# Patient Record
Sex: Female | Born: 1960
Health system: Southern US, Community
[De-identification: ages and names within clinical notes are randomized; demographics above are authoritative.]

## PROBLEM LIST (undated history)

## (undated) DIAGNOSIS — R053 Chronic cough: Secondary | ICD-10-CM

## (undated) DIAGNOSIS — R05 Cough: Secondary | ICD-10-CM

## (undated) DIAGNOSIS — K219 Gastro-esophageal reflux disease without esophagitis: Secondary | ICD-10-CM

## (undated) DIAGNOSIS — Z9889 Other specified postprocedural states: Secondary | ICD-10-CM

## (undated) DIAGNOSIS — J40 Bronchitis, not specified as acute or chronic: Secondary | ICD-10-CM

## (undated) DIAGNOSIS — R112 Nausea with vomiting, unspecified: Secondary | ICD-10-CM

## (undated) DIAGNOSIS — J45909 Unspecified asthma, uncomplicated: Secondary | ICD-10-CM

## (undated) HISTORY — PX: TONSILLECTOMY: SUR1361

## (undated) HISTORY — DX: Chronic cough: R05.3

## (undated) HISTORY — DX: Cough: R05

## (undated) HISTORY — PX: OOPHORECTOMY: SHX86

---

## 2001-12-31 ENCOUNTER — Other Ambulatory Visit: Admission: RE | Admit: 2001-12-31 | Discharge: 2001-12-31 | Payer: Self-pay | Admitting: Obstetrics and Gynecology

## 2003-02-03 ENCOUNTER — Other Ambulatory Visit: Admission: RE | Admit: 2003-02-03 | Discharge: 2003-02-03 | Payer: Self-pay | Admitting: Obstetrics and Gynecology

## 2004-04-20 ENCOUNTER — Other Ambulatory Visit: Admission: RE | Admit: 2004-04-20 | Discharge: 2004-04-20 | Payer: Self-pay | Admitting: Obstetrics and Gynecology

## 2004-04-30 ENCOUNTER — Encounter: Admission: RE | Admit: 2004-04-30 | Discharge: 2004-04-30 | Payer: Self-pay | Admitting: Obstetrics and Gynecology

## 2004-10-21 ENCOUNTER — Encounter: Admission: RE | Admit: 2004-10-21 | Discharge: 2004-10-21 | Payer: Self-pay | Admitting: Obstetrics and Gynecology

## 2005-08-03 ENCOUNTER — Other Ambulatory Visit: Admission: RE | Admit: 2005-08-03 | Discharge: 2005-08-03 | Payer: Self-pay | Admitting: Obstetrics and Gynecology

## 2005-08-03 ENCOUNTER — Encounter: Admission: RE | Admit: 2005-08-03 | Discharge: 2005-08-03 | Payer: Self-pay | Admitting: Obstetrics and Gynecology

## 2006-10-05 ENCOUNTER — Encounter: Admission: RE | Admit: 2006-10-05 | Discharge: 2006-10-05 | Payer: Self-pay | Admitting: Obstetrics and Gynecology

## 2007-10-08 ENCOUNTER — Encounter: Admission: RE | Admit: 2007-10-08 | Discharge: 2007-10-08 | Payer: Self-pay | Admitting: Obstetrics and Gynecology

## 2010-12-26 ENCOUNTER — Encounter: Payer: Self-pay | Admitting: Obstetrics and Gynecology

## 2011-05-10 ENCOUNTER — Encounter: Payer: Self-pay | Admitting: Internal Medicine

## 2011-05-12 ENCOUNTER — Encounter: Payer: Self-pay | Admitting: Internal Medicine

## 2011-05-12 ENCOUNTER — Ambulatory Visit (INDEPENDENT_AMBULATORY_CARE_PROVIDER_SITE_OTHER): Payer: BC Managed Care – PPO | Admitting: Internal Medicine

## 2011-05-12 VITALS — BP 126/82 | HR 87 | Temp 98.5°F | Ht 63.0 in | Wt 206.4 lb

## 2011-05-12 DIAGNOSIS — R053 Chronic cough: Secondary | ICD-10-CM | POA: Insufficient documentation

## 2011-05-12 DIAGNOSIS — R059 Cough, unspecified: Secondary | ICD-10-CM

## 2011-05-12 DIAGNOSIS — R05 Cough: Secondary | ICD-10-CM | POA: Insufficient documentation

## 2011-05-12 NOTE — Assessment & Plan Note (Addendum)
Your chronic cough is probably combination of sinus drainage, possible  Acid reflux, possibe asthma, possible cyclical cough # for sinus  - stop cetrizine and other anti histamines  - take chlorpheniramine 4mg  x 2 tablets each night - if this makes you too sleepy or dry back off to 1 tablet daily  - take sudafed 12h XR tablet x 1 daily  - try QNASL 2 squirts each nostril daily - take 2 samples # for acid reflux  - take diet sheet  - take over the counter zegerid 20mg  capsule once each morning on empty stomach   - stop fish oil # for possible cyclical cough  - suck on lozenges or sugarless mint or drink water each time you have an urge to cough #for possible asthma  - do methacholine challenge test  #FOLLOWUP  - important you follow each advice 100%  - return in 4-6 weeks - depending on course will get CT scan\ - repeat exertional pulse ox at followup (CMA noted she desaturated but did not inform me of that when patient left office)

## 2011-05-12 NOTE — Patient Instructions (Addendum)
Your chronic cough is probably combination of sinus drainage, possible  Acid reflux, possibe asthma, possible cyclical cough # for sinus  - stop cetrizine and other anti histamines  - take chlorpheniramine 4mg  x 2 tablets each night - if this makes you too sleepy or dry back off to 1 tablet daily  - take sudafed 12h XR tablet x 1 daily  - try QNASL 2 squirts each nostril daily - take 2 samples # for acid reflux  - take diet sheet  - take over the counter zegerid 20mg  capsule once each morning on empty stomach   - stop fish oil # for possible cyclical cough  - suck on lozenges or sugarless mint or drink water each time you have an urge to cough #for possible asthma  - do methacholine challenge test  #FOLLOWUP  - important you follow each advice 100%  - return in 4-6 weeks - depending on course CT scan - repeat exertional pulse ox at followup (CMA notes she desaturated but I was not informed)

## 2011-05-12 NOTE — Progress Notes (Signed)
Subjective:    Patient ID: Charlotte Roth, female    DOB: 12/29/1960, 50 y.o.   MRN: [id number]  Cough This is a new (50 year old overweight/obese female. CHronic Cough x all life. Dad died from pulmonary fibrosis 04-Sep-2010(Dr Danise Mina), Mom with asthma. So she is concerned about cough) problem. The current episode started more than 1 year ago (Present "all life" as long as a teenager and she could remember. ). The problem has been waxing and waning (winter time and cold air make it worse). The problem occurs constantly. The cough is productive of sputum (clear white sputum but rarely has color when has infection; took antibiotics one month ago ). Associated symptoms include postnasal drip. Pertinent negatives include no chest pain, chills, ear congestion, ear pain, eye redness, fever, headaches, heartburn, hemoptysis, myalgias, nasal congestion, rash, rhinorrhea, sore throat, shortness of breath, sweats, weight loss or wheezing. Associated symptoms comments: Socially embarrassing cough. People at work and husband upset about cough. Reports long hx of allergies - food allergies years ago, seasonal like pollen, dust, cats, dogs. Positive skin test by Dr. Madison Hickman in 1990s in [place] and was on allergy shots for few years in the 1990s that she did not think helped. Has strong GAG + and THROWS UP OFTEN ESP at NIGHT  - atleast 1-2 times per week. Denies diagnosis of GERD or heartburn but admits to STRONG POSNASAL DRIP. GAG - brought on by cougghing and sinus drainage and netti pot. The symptoms are aggravated by cold air, dust, exercise, fumes and pollens (Has 2 cats but she is unsure if it makes it worse though in 1990s was skin test positive for cats. Talking a lot during [employer] job makes it worse). 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 She has tried OTC cough suppressant (remote past has had allergy  shots. Tessalon perles do not help. Past year tried zyrtec, allergra - no help. Netti pot  helps somewhat but makes her GAG +) for the symptoms. The treatment provided no relief. Her past medical history is significant for environmental allergies. There is no history of asthma, bronchiectasis, bronchitis, COPD, emphysema or pneumonia.      Review of Systems  Constitutional: Positive for unexpected weight change. Negative for fever, chills and weight loss.  HENT: Positive for congestion and postnasal drip. Negative for ear pain, nosebleeds, sore throat, rhinorrhea, sneezing, trouble swallowing, dental problem and sinus pressure.   Eyes: Negative for redness and itching.  Respiratory: Positive for cough. Negative for hemoptysis, chest tightness, shortness of breath and wheezing.   Cardiovascular: Negative for chest pain, palpitations and leg swelling.  Gastrointestinal: Negative for heartburn, nausea and vomiting.  Genitourinary: Negative for dysuria.  Musculoskeletal: Negative for myalgias and joint swelling.  Skin: Negative for rash.  Neurological: Negative for headaches.  Hematological: Positive for environmental allergies. Does not bruise/bleed easily.  Psychiatric/Behavioral: Negative for dysphoric mood. The patient is not nervous/anxious.        Objective:   Physical Exam  Vitals reviewed. Constitutional: She is oriented to person, place, and time. She appears well-developed and well-nourished. No distress.       obese  HENT:  Head: Normocephalic and atraumatic.  Right Ear: External ear normal.  Left Ear: External ear normal.  Mouth/Throat: Oropharynx is clear and moist. No oropharyngeal exudate.  Eyes: Conjunctivae and EOM are normal. Pupils are equal, round, and reactive to light. Right  eye exhibits no discharge. Left eye exhibits no discharge. No scleral icterus.  Neck: Normal range of motion. Neck supple. No JVD present. No tracheal deviation present. No thyromegaly present.         mallampatti class 2  Cardiovascular: Normal rate, regular rhythm, normal heart sounds and intact distal pulses.  Exam reveals no gallop and no friction rub.   No murmur heard. Pulmonary/Chest: Effort normal and breath sounds normal. No respiratory distress. She has no wheezes. She has no rales. She exhibits no tenderness.  Abdominal: Soft. Bowel sounds are normal. She exhibits no distension and no mass. There is no tenderness. There is no rebound and no guarding.  Musculoskeletal: Normal range of motion. She exhibits no edema and no tenderness.  Lymphadenopathy:    She has no cervical adenopathy.  Neurological: She is alert and oriented to person, place, and time. She has normal reflexes. No cranial nerve deficit. She exhibits normal muscle tone. Coordination normal.  Skin: Skin is warm and dry. No rash noted. She is not diaphoretic. No erythema. No pallor.  Psychiatric: She has a normal mood and affect. Her behavior is normal. Judgment and thought content normal.          Assessment & Plan:

## 2011-05-12 NOTE — Progress Notes (Addendum)
SATURATION QUALIFICATIONS:  Patient Saturations on Room Air at Rest = 93%  Patient Saturations on Room Air while Ambulating = 83%  Patient Saturations on 2 Liters of oxygen while Ambulating = 93%  Walk entered into wrong chart. Pt did not do a walking saturation test. Carron Curie, CMA

## 2011-05-13 ENCOUNTER — Encounter: Payer: Self-pay | Admitting: Internal Medicine

## 2011-05-18 ENCOUNTER — Ambulatory Visit (HOSPITAL_COMMUNITY)
Admission: RE | Admit: 2011-05-18 | Discharge: 2011-05-18 | Disposition: A | Discharge status: Home / Self Care | Payer: BC Managed Care – PPO | Source: Ambulatory Visit | Attending: Internal Medicine | Admitting: Internal Medicine

## 2011-05-18 DIAGNOSIS — R059 Cough, unspecified: Secondary | ICD-10-CM | POA: Insufficient documentation

## 2011-05-18 DIAGNOSIS — R05 Cough: Secondary | ICD-10-CM

## 2011-05-18 DIAGNOSIS — R053 Chronic cough: Secondary | ICD-10-CM

## 2011-06-06 ENCOUNTER — Telehealth: Payer: Self-pay | Admitting: Internal Medicine

## 2011-06-06 NOTE — Telephone Encounter (Signed)
leth her know methacholine challenge test 05/18/2011 is normal and no evidence of asthma to explain cough

## 2011-06-09 NOTE — Telephone Encounter (Signed)
Pt aware. Jennifer Castillo, CMA  

## 2011-06-13 ENCOUNTER — Encounter: Payer: Self-pay | Admitting: Internal Medicine

## 2011-06-28 ENCOUNTER — Encounter: Payer: Self-pay | Admitting: Internal Medicine

## 2011-06-28 ENCOUNTER — Ambulatory Visit (INDEPENDENT_AMBULATORY_CARE_PROVIDER_SITE_OTHER): Payer: BC Managed Care – PPO | Admitting: Internal Medicine

## 2011-06-28 VITALS — BP 120/70 | HR 94 | Temp 98.5°F | Ht 63.5 in | Wt 201.8 lb

## 2011-06-28 DIAGNOSIS — R059 Cough, unspecified: Secondary | ICD-10-CM

## 2011-06-28 DIAGNOSIS — R053 Chronic cough: Secondary | ICD-10-CM

## 2011-06-28 DIAGNOSIS — R05 Cough: Secondary | ICD-10-CM

## 2011-06-28 NOTE — Patient Instructions (Addendum)
Your chronic cough is probably combination of sinus drainage, possible  Acid reflux, possible cyclical cough # for sinus  - continue chlorpheniramine 4mg  x 2 tablets each night - if this makes you too sleepy or dry back off to 1 tablet daily  - take sudafed 12h XR tablet x 1 daily  - continue QNASL 2 squirts each nostril daily - take 2 samples and prescription - iff too expensive call us # for acid reflux  - take diet sheet again and follow all we discussed  - take over the counter zegerid 20mg  capsule once each morning on empty stomach   - do not go back to  fish oil # for possible cyclical cough  - suck on lozenges or sugarless mint or drink water each time you have an urge to cough  - talke 3 days off for complete voice rest - no whispering or talking - in next 2-3 weeks #FOLLOWUP  - important you follow each advice 100%  - return in 6 weeks - depending on course CT scan

## 2011-06-28 NOTE — Progress Notes (Signed)
  Subjective:    Patient ID: Charlotte Roth, female    DOB: 14-Sep-1961, 50 y.o.   MRN: [id number]  HPI Followup Multifactorial cough  OV 06/28/11: Last visit 05/12/11. Multifactorial interventions done - LPR cough with sinus, gerd issues. Following interventions, cough 60% better in terms of severity and frequency. Cough still dry. GAggin and throwing up at night present but reduced. Postnasal drip reduced but still present. Still socially embarrassing. 2 tabs chlorphen making her dry; > 90% compliant. Takes sudafed. Finds QNASL wonderful and best help. However, having difficulty with GERD precatuions especially the diet sheet and zegerid compliance. Notes that coming off zegerid x 3 days, made cough worse. Did have methacholine challenget test - negative on 05/18/11 for asthma.   No new issues  Past medical, Family hx and Social hx: reviewd. No changes noted since last vist   Review of Systems  Constitutional: Negative for fever and unexpected weight change.  HENT: Negative for ear pain, nosebleeds, congestion, sore throat, rhinorrhea, sneezing, trouble swallowing, dental problem, postnasal drip and sinus pressure.   Eyes: Negative for redness and itching.  Respiratory: Positive for cough. Negative for chest tightness, shortness of breath and wheezing.   Cardiovascular: Negative for palpitations and leg swelling.  Gastrointestinal: Negative for nausea and vomiting.  Genitourinary: Negative for dysuria.  Musculoskeletal: Negative for joint swelling.  Skin: Negative for rash.  Neurological: Negative for headaches.  Hematological: Does not bruise/bleed easily.  Psychiatric/Behavioral: Negative for dysphoric mood. The patient is not nervous/anxious.        Objective:   Physical Exam     Vitals reviewed. Constitutional: She is oriented to person, place, and time. She appears well-developed and well-nourished. No distress.       obese  HENT:  Head: Normocephalic and atraumatic.  Right Ear:  External ear normal.  Left Ear: External ear normal.  Mouth/Throat: Oropharynx is clear and moist. No oropharyngeal exudate.  Eyes: Conjunctivae and EOM are normal. Pupils are equal, round, and reactive to light. Right eye exhibits no discharge. Left eye exhibits no discharge. No scleral icterus.  Neck: Normal range of motion. Neck supple. No JVD present. No tracheal deviation present. No thyromegaly present.       mallampatti class 2  Cardiovascular: Normal rate, regular rhythm, normal heart sounds and intact distal pulses.  Exam reveals no gallop and no friction rub.   No murmur heard. Pulmonary/Chest: Effort normal and breath sounds normal. No respiratory distress. She has no wheezes. She has no rales. She exhibits no tenderness.  Abdominal: Soft. Bowel sounds are normal. She exhibits no distension and no mass. There is no tenderness. There is no rebound and no guarding.  Musculoskeletal: Normal range of motion. She exhibits no edema and no tenderness.  Lymphadenopathy:    She has no cervical adenopathy.  Neurological: She is alert and oriented to person, place, and time. She has normal reflexes. No cranial nerve deficit. She exhibits normal muscle tone. Coordination normal.  Skin: Skin is warm and dry. No rash noted. She is not diaphoretic. No erythema. No pallor.  Psychiatric: She has a normal mood and affect. Her behavior is normal. Judgment and thought content normal.      Assessment & Plan:

## 2011-07-22 ENCOUNTER — Encounter: Payer: Self-pay | Admitting: Internal Medicine

## 2011-07-22 NOTE — Assessment & Plan Note (Addendum)
Improved 60% but still with residual cough. Asthma ruled out  PLAN Your chronic cough is probably combination of sinus drainage, possible  Acid reflux,  possible cyclical cough # for sinus  - continue chlorpheniramine 4mg  x 2 tablets each night - if this makes you too sleepy or dry back off to 1 tablet daily  - take sudafed 12h XR tablet x 1 daily  - continue QNASL 2 squirts each nostril daily - take 2 samples and prescription - iff too expensive call us # for acid reflux  - take diet sheet again and follow all we discussed  - take over the counter zegerid 20mg  capsule once each morning on empty stomach   - do not go back to  fish oil # for possible cyclical cough  - suck on lozenges or sugarless mint or drink water each time you have an urge to cough  - talke 3 days off for complete voice rest - no whispering or talking - in next 2-3 weeks #FOLLOWUP  - important you follow each advice 100%  - return in 6 weeks - depending on course CT scan

## 2011-08-17 ENCOUNTER — Encounter: Payer: Self-pay | Admitting: Internal Medicine

## 2011-08-17 ENCOUNTER — Ambulatory Visit (INDEPENDENT_AMBULATORY_CARE_PROVIDER_SITE_OTHER): Payer: BC Managed Care – PPO | Admitting: Internal Medicine

## 2011-08-17 VITALS — BP 126/82 | HR 88 | Temp 98.0°F | Ht 63.0 in | Wt 198.6 lb

## 2011-08-17 DIAGNOSIS — R059 Cough, unspecified: Secondary | ICD-10-CM

## 2011-08-17 DIAGNOSIS — R05 Cough: Secondary | ICD-10-CM

## 2011-08-17 DIAGNOSIS — R053 Chronic cough: Secondary | ICD-10-CM

## 2011-08-17 MED ORDER — GABAPENTIN 300 MG PO CAPS: ORAL_CAPSULE | ORAL | Status: DC

## 2011-08-17 NOTE — Patient Instructions (Signed)
Your chronic cough is probably combination of sinus drainage, possible  Acid reflux, possible cyclical cough # for sinus  - continue chlorpheniramine 4mg  x 2 tablets each night - if this makes you too sleepy or dry back off to 1 tablet daily  - continue sudafed 12h XR tablet x 1 daily  - continue QNASL 2 squirts each nostril daily - take 2 samples   - due to lack of control with above please see ENT Dr Annalee Genta or DR Jenne Pane # for acid reflux  - take diet sheet again and follow all we discussed with 100% compliance atleats for 1-2 months  - take over the counter zegerid 20mg  capsule once each morning on empty stomach   - continue to avoid back to  fish oil # for possible cyclical cough  - suck on lozenges or sugarless mint or drink water each time you have an urge to cough  - talke 3 days off for complete voice rest - no whispering or talking - in next 2-3 weeks  - start generic gabapentin 300mg  daily x 5 days, then 300mg  twice daily x 5 days, then 300mg  three times daily to continue; if making you too sleepy or dry call us #FOLLOWUP  - important you follow each advice 100%  - return in 5-6 weeks - depending on course CT scan of chest

## 2011-08-17 NOTE — Assessment & Plan Note (Addendum)
Cough not being controlled.  Sinus - no control despite 3 drugs - will get her to ENT.  GERD - not compliant with diet. I stressed the need for100% compliance with diet atleast for 6-8 weeks; she agrees. Continue zegerid.  LPR - she agrees to voice rest and trial neurontin after counseling on side effects and recent lancet study.  She is not interested in pursuing CT chest at this point; wants to address above first (her dad had pulmonary fibrosis)  25 min visit. > 50% of time in face to face counseling

## 2011-08-17 NOTE — Progress Notes (Signed)
Subjective:    Patient ID: Charlotte Roth, female    DOB: 04-03-1961, 50 y.o.   MRN: [id number]  HPI Followup Multifactorial cough  OV 06/28/11: Last visit 05/12/11. Multifactorial interventions done - LPR cough with sinus, gerd issues. Following interventions, cough 60% better in terms of severity and frequency. Cough still dry. GAggin and throwing up at night present but reduced. Postnasal drip reduced but still present. Still socially embarrassing. 2 tabs chlorphen making her dry; > 90% compliant. Takes sudafed. Finds QNASL wonderful and best help. However, having difficulty with GERD precatuions especially the diet sheet and zegerid compliance. Notes that coming off zegerid x 3 days, made cough worse. Did have methacholine challenget test - negative on 05/18/11 for asthma.   No new issues  Your chronic cough is probably combination of sinus drainage, possible Acid reflux, possible cyclical cough  # for sinus  - continue chlorpheniramine 4mg  x 2 tablets each night - if this makes you too sleepy or dry back off to 1 tablet daily  - take sudafed 12h XR tablet x 1 daily  - continue QNASL 2 squirts each nostril daily - take 2 samples and prescription - iff too expensive call us  # for acid reflux  - take diet sheet again and follow all we discussed  - take over the counter zegerid 20mg  capsule once each morning on empty stomach  - do not go back to fish oil  # for possible cyclical cough  - suck on lozenges or sugarless mint or drink water each time you have an urge to cough  - talke 3 days off for complete voice rest - no whispering or talking - in next 2-3 weeks  #FOLLOWUP  - important you follow each advice 100%  - return in 6 weeks  - depending on course CT scan  OV 08/17/11: Followup for above. States that cough imprved till week ago but past week cough is worse. States significantly worse - worse than prior ov in July but not as bad as first time she saw me (2 visits ago). Currently rates  cough as moderate. Cough currently productive - whitish and orange (before it was clear). No associated wheezing but gags + when cough present. RSI cough score is 12 of 35 which is below the cut off for LPR cough but then she is on partial Rx. Significant associated post nasal drainage persists; this is worse. Post nsal drip worse despite taking 2 tabs chlorphen at night, morning sudafed and daily QNASL for which she is compliant. In terms of GERD control: compliant with zegerid which she is compliant. But is struggling with diet control; made some improvements like changing to decaf coffee but feels she is not strict about diet at all. Of note, did not do 3 days of voice rest (she says she talks a lot and cannot get 3 days to get voice rest though she joked that her husband would love it. 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     Review of Systems  Constitutional: Negative for fever and unexpected weight change.  HENT: Positive for congestion and postnasal drip. Negative for ear pain, nosebleeds, sore throat, rhinorrhea, sneezing, trouble swallowing, dental problem and sinus pressure.   Eyes: Negative for redness and itching.  Respiratory: Positive for cough. Negative for chest tightness, shortness of breath and wheezing.   Cardiovascular: Negative for palpitations and leg swelling.  Gastrointestinal: Negative for nausea and vomiting.  Genitourinary: Negative for dysuria.  Musculoskeletal:  Negative for joint swelling.  Skin: Negative for rash.  Neurological: Positive for dizziness. Negative for headaches.  Hematological: Does not bruise/bleed easily.  Psychiatric/Behavioral: Negative for dysphoric mood. The patient is not nervous/anxious.        Objective:   Physical Exam  Vitals reviewed. Constitutional: She is oriented to person, place, and time. She appears well-developed and well-nourished. No distress.       obese  HENT:  Head: Normocephalic and atraumatic.  Right Ear:  External ear normal.  Left Ear: External ear normal.  Mouth/Throat: Oropharynx is clear and moist. No oropharyngeal exudate.  Eyes: Conjunctivae and EOM are normal. Pupils are equal, round, and reactive to light. Right eye exhibits no discharge. Left eye exhibits no discharge. No scleral icterus.  Neck: Normal range of motion. Neck supple. No JVD present. No tracheal deviation present. No thyromegaly present.       mallampatti class 2  Cardiovascular: Normal rate, regular rhythm, normal heart sounds and intact distal pulses.  Exam reveals no gallop and no friction rub.   No murmur heard. Pulmonary/Chest: Effort normal and breath sounds normal. No respiratory distress. She has no wheezes. She has no rales. She exhibits no tenderness.  Abdominal: Soft. Bowel sounds are normal. She exhibits no distension and no mass. There is no tenderness. There is no rebound and no guarding.  Musculoskeletal: Normal range of motion. She exhibits no edema and no tenderness.  Lymphadenopathy:    She has no cervical adenopathy.  Neurological: She is alert and oriented to person, place, and time. She has normal reflexes. No cranial nerve deficit. She exhibits normal muscle tone. Coordination normal.  Skin: Skin is warm and dry. No rash noted. She is not diaphoretic. No erythema. No pallor.  Psychiatric: She has a normal mood and affect. Her behavior is normal. Judgment and thought content normal.        Assessment & Plan:

## 2011-09-26 ENCOUNTER — Ambulatory Visit (INDEPENDENT_AMBULATORY_CARE_PROVIDER_SITE_OTHER): Payer: BC Managed Care – PPO | Admitting: Internal Medicine

## 2011-09-26 ENCOUNTER — Encounter: Payer: Self-pay | Admitting: Internal Medicine

## 2011-09-26 VITALS — BP 130/92 | HR 98 | Temp 98.8°F | Ht 63.0 in | Wt 198.6 lb

## 2011-09-26 DIAGNOSIS — R059 Cough, unspecified: Secondary | ICD-10-CM

## 2011-09-26 DIAGNOSIS — R053 Chronic cough: Secondary | ICD-10-CM

## 2011-09-26 DIAGNOSIS — R05 Cough: Secondary | ICD-10-CM

## 2011-09-26 DIAGNOSIS — Z23 Encounter for immunization: Secondary | ICD-10-CM

## 2011-09-26 NOTE — Patient Instructions (Signed)
I think the bronchitis after the [place] trip has made things worse Glad you are beginning to feel better and you have restarted meds for chronic cough control For sinus, you can stop sudafed due to concerns of bp. But continue QNASL spray (take samples) and night time chlor tab For acid reflux, ok to change from zegerid to omeprazole. But make sure you follow diet sheet strictly (the ent eval showed severe reflux) For cyclical cough element, call me in several weeks to tell me how you feel so we can decide on the neurontin I respect your desire to be minimalist with meds: you will find diet control helps immensely for reflux Return in 3 months or sooner if needed HAve flu shot today

## 2011-09-26 NOTE — Assessment & Plan Note (Signed)
I think the bronchitis after the [place] trip has made things worse Glad you are beginning to feel better and you have restarted meds for chronic cough control For sinus, you can stop sudafed due to your  concerns of possible high bp. But continue QNASL spray (take samples) and night time chlor tab For acid reflux, ok to change from zegerid to omeprazole. But make sure you follow diet sheet strictly (the ent eval showed severe reflux) For cyclical cough element, call me in several weeks to tell me how you feel so we can decide on the neurontin I respect your desire to be minimalist with meds: you will find diet control helps immensely for reflux Return in 3 months or sooner if needed HAve flu shot today

## 2011-09-26 NOTE — Progress Notes (Signed)
Subjective:    Patient ID: Charlotte Roth, female    DOB: 1961-08-26, 50 y.o.   MRN: [id number]  HPI OV 06/28/11: Last visit 05/12/11. Multifactorial interventions done - LPR cough with sinus, gerd issues. Following interventions, cough 60% better in terms of severity and frequency. Cough still dry. GAggin and throwing up at night present but reduced. Postnasal drip reduced but still present. Still socially embarrassing. 2 tabs chlorphen making her dry; > 90% compliant. Takes sudafed. Finds QNASL wonderful and best help. However, having difficulty with GERD precatuions especially the diet sheet and zegerid compliance. Notes that coming off zegerid x 3 days, made cough worse. Did have methacholine challenget test - negative on 05/18/11 for asthma.   No new issues   Your chronic cough is probably combination of sinus drainage, possible Acid reflux, possible cyclical cough  # for sinus  - continue chlorpheniramine 4mg  x 2 tablets each night - if this makes you too sleepy or dry back off to 1 tablet daily  - continue sudafed 12h XR tablet x 1 daily  - continue QNASL 2 squirts each nostril daily - take 2 samples  - due to lack of control with above please see ENT Dr Annalee Genta or DR Jenne Pane  # for acid reflux  - take diet sheet again and follow all we discussed with 100% compliance atleats for 1-2 months  - take over the counter zegerid 20mg  capsule once each morning on empty stomach  - continue to avoid back to fish oil  # for possible cyclical cough  - suck on lozenges or sugarless mint or drink water each time you have an urge to cough  - talke 3 days off for complete voice rest - no whispering or talking - in next 2-3 weeks  - start generic gabapentin 300mg  daily x 5 days, then 300mg  twice daily x 5 days, then 300mg  three times daily to continue; if making you too sleepy or dry call us  #FOLLOWUP  - important you follow each advice 100%  - return in 5-6 weeks  - depending on course CT scan of  chest  OV 09/26/11: Saw ENT 09/01/11: severe GE refulx on their eval per their notes. Per her there was also deviated nasal septum. STates a week after ENT visit cough was improving and then went to [place]. At this point cough had significantly improved. She did not start neurontin due to impending trip. Return a week later (around 2 weeks ago). Developed URI/Bronchitis on return. Has had 2 courses of antbiotics. Cough got much worse but now Cough now better. During this trip and since return has stopped all cough control medications. But starting yesterday has restarted them. She is concerned about impact of polypharmacy. She is currently not very keen to restart neurontin. Not following GE Reflux diet strictly either  Past medical: ENT consult end sept 2012: severe GE reflux. Bronchitis  2 weeks ago Social: recent trip to [place] Family hx: no change  Review of Systems  Constitutional: Negative for fever and unexpected weight change.  HENT: Negative for ear pain, nosebleeds, congestion, sore throat, rhinorrhea, sneezing, trouble swallowing, dental problem, postnasal drip and sinus pressure.   Eyes: Negative for redness and itching.  Respiratory: Positive for cough. Negative for chest tightness, shortness of breath and wheezing.   Cardiovascular: Negative for palpitations and leg swelling.  Gastrointestinal: Negative for nausea and vomiting.  Genitourinary: Negative for dysuria.  Musculoskeletal: Negative for joint swelling.  Skin: Negative for rash.  Neurological:  Negative for headaches.  Hematological: Does not bruise/bleed easily.  Psychiatric/Behavioral: Negative for dysphoric mood. The patient is not nervous/anxious.        Objective:   Physical Exam Constitutional: She is oriented to person, place, and time. She appears well-developed and well-nourished. No distress.       obese  HENT:  Head: Normocephalic and atraumatic.  Right Ear: External ear normal.  Left Ear: External ear  normal.  Mouth/Throat: Oropharynx is clear and moist. No oropharyngeal exudate.  Eyes: Conjunctivae and EOM are normal. Pupils are equal, round, and reactive to light. Right eye exhibits no discharge. Left eye exhibits no discharge. No scleral icterus.  Neck: Normal range of motion. Neck supple. No JVD present. No tracheal deviation present. No thyromegaly present.       mallampatti class 2  Cardiovascular: Normal rate, regular rhythm, normal heart sounds and intact distal pulses.  Exam reveals no gallop and no friction rub.   No murmur heard. Pulmonary/Chest: Effort normal and breath sounds normal. No respiratory distress. She has no wheezes. She has no rales. She exhibits no tenderness.  Abdominal: Soft. Bowel sounds are normal. She exhibits no distension and no mass. There is no tenderness. There is no rebound and no guarding.  Musculoskeletal: Normal range of motion. She exhibits no edema and no tenderness.  Lymphadenopathy:    She has no cervical adenopathy.  Neurological: She is alert and oriented to person, place, and time. She has normal reflexes. No cranial nerve deficit. She exhibits normal muscle tone. Coordination normal.  Skin: Skin is warm and dry. No rash noted. She is not diaphoretic. No erythema. No pallor.  Psychiatric: She has a normal mood and affect. Her behavior is normal. Judgment and thought content normal.         Assessment & Plan:

## 2011-11-01 ENCOUNTER — Other Ambulatory Visit: Payer: Self-pay | Admitting: Allergy and Immunology

## 2011-11-01 ENCOUNTER — Ambulatory Visit
Admission: RE | Admit: 2011-11-01 | Discharge: 2011-11-01 | Disposition: A | Discharge status: Home / Self Care | Payer: BC Managed Care – PPO | Source: Ambulatory Visit | Attending: Allergy and Immunology | Admitting: Allergy and Immunology

## 2011-11-01 DIAGNOSIS — R059 Cough, unspecified: Secondary | ICD-10-CM

## 2011-11-01 DIAGNOSIS — R05 Cough: Secondary | ICD-10-CM

## 2011-11-01 DIAGNOSIS — J329 Chronic sinusitis, unspecified: Secondary | ICD-10-CM

## 2013-05-09 ENCOUNTER — Encounter: Payer: Self-pay | Admitting: Gastroenterology

## 2013-05-13 ENCOUNTER — Ambulatory Visit
Admission: RE | Admit: 2013-05-13 | Discharge: 2013-05-13 | Disposition: A | Discharge status: Home / Self Care | Payer: BC Managed Care – PPO | Source: Ambulatory Visit | Attending: Allergy and Immunology | Admitting: Allergy and Immunology

## 2013-05-13 ENCOUNTER — Other Ambulatory Visit: Payer: Self-pay | Admitting: Allergy and Immunology

## 2013-05-13 DIAGNOSIS — R059 Cough, unspecified: Secondary | ICD-10-CM

## 2013-05-13 DIAGNOSIS — R05 Cough: Secondary | ICD-10-CM

## 2014-04-15 ENCOUNTER — Other Ambulatory Visit: Payer: Self-pay | Admitting: Allergy and Immunology

## 2014-04-15 ENCOUNTER — Ambulatory Visit
Admission: RE | Admit: 2014-04-15 | Discharge: 2014-04-15 | Disposition: A | Discharge status: Home / Self Care | Payer: BC Managed Care – PPO | Source: Ambulatory Visit | Attending: Allergy and Immunology | Admitting: Allergy and Immunology

## 2014-04-15 DIAGNOSIS — R059 Cough, unspecified: Secondary | ICD-10-CM

## 2014-04-15 DIAGNOSIS — R05 Cough: Secondary | ICD-10-CM

## 2014-12-04 ENCOUNTER — Other Ambulatory Visit: Payer: Self-pay | Admitting: Obstetrics and Gynecology

## 2014-12-04 DIAGNOSIS — R928 Other abnormal and inconclusive findings on diagnostic imaging of breast: Secondary | ICD-10-CM

## 2014-12-17 ENCOUNTER — Ambulatory Visit
Admission: RE | Admit: 2014-12-17 | Discharge: 2014-12-17 | Disposition: A | Discharge status: Home / Self Care | Payer: BLUE CROSS/BLUE SHIELD | Source: Ambulatory Visit | Attending: Obstetrics and Gynecology | Admitting: Obstetrics and Gynecology

## 2014-12-17 DIAGNOSIS — R928 Other abnormal and inconclusive findings on diagnostic imaging of breast: Secondary | ICD-10-CM

## 2016-02-16 ENCOUNTER — Institutional Professional Consult (permissible substitution): Payer: BLUE CROSS/BLUE SHIELD | Admitting: Internal Medicine

## 2016-03-08 ENCOUNTER — Encounter: Payer: Self-pay | Admitting: Internal Medicine

## 2016-03-08 ENCOUNTER — Ambulatory Visit (INDEPENDENT_AMBULATORY_CARE_PROVIDER_SITE_OTHER): Payer: BLUE CROSS/BLUE SHIELD | Admitting: Internal Medicine

## 2016-03-08 VITALS — BP 146/96 | HR 89 | Ht 63.0 in | Wt 200.4 lb

## 2016-03-08 DIAGNOSIS — R05 Cough: Secondary | ICD-10-CM

## 2016-03-08 DIAGNOSIS — J387 Other diseases of larynx: Secondary | ICD-10-CM | POA: Diagnosis not present

## 2016-03-08 DIAGNOSIS — R053 Chronic cough: Secondary | ICD-10-CM

## 2016-03-08 LAB — NITRIC OXIDE: NITRIC OXIDE: 5

## 2016-03-08 MED ORDER — GABAPENTIN 300 MG PO CAPS: ORAL_CAPSULE | ORAL | Status: DC

## 2016-03-08 NOTE — Progress Notes (Signed)
Subjective:    Patient ID: Charlotte Roth, female    DOB: Dec 01, 1961, 55 y.o.   MRN: [id number]  PCP Jani Gravel, MD   HPI Followup Multifactorial cough  OV 06/28/11: Last visit 05/12/11. Multifactorial interventions done - LPR cough with sinus, gerd issues. Following interventions, cough 60% better in terms of severity and frequency. Cough still dry. GAggin and throwing up at night present but reduced. Postnasal drip reduced but still present. Still socially embarrassing. 2 tabs chlorphen making her dry; > 90% compliant. Takes sudafed. Finds QNASL wonderful and best help. However, having difficulty with GERD precatuions especially the diet sheet and zegerid compliance. Notes that coming off zegerid x 3 days, made cough worse. Did have methacholine challenget test - negative on 05/18/11 for asthma.   No new issues  Your chronic cough is probably combination of sinus drainage, possible Acid reflux, possible cyclical cough  # for sinus  - continue chlorpheniramine 4mg  x 2 tablets each night - if this makes you too sleepy or dry back off to 1 tablet daily  - take sudafed 12h XR tablet x 1 daily  - continue QNASL 2 squirts each nostril daily - take 2 samples and prescription - iff too expensive call us  # for acid reflux  - take diet sheet again and follow all we discussed  - take over the counter zegerid 20mg  capsule once each morning on empty stomach  - do not go back to fish oil  # for possible cyclical cough  - suck on lozenges or sugarless mint or drink water each time you have an urge to cough  - talke 3 days off for complete voice rest - no whispering or talking - in next 2-3 weeks  #FOLLOWUP  - important you follow each advice 100%  - return in 6 weeks  - depending on course CT scan    OV 08/17/11: Followup for above. States that cough imprved till week ago but past week cough is worse. States significantly worse - worse than prior ov in July but not as bad as first time she saw me  (2 visits ago). Currently rates cough as moderate. Cough currently productive - whitish and orange (before it was clear). No associated wheezing but gags + when cough present. RSI cough score is 12 of 35 which is below the cut off for LPR cough but then she is on partial Rx. Significant associated post nasal drainage persists; this is worse. Post nsal drip worse despite taking 2 tabs chlorphen at night, morning sudafed and daily QNASL for which she is compliant. In terms of GERD control: compliant with zegerid which she is compliant. But is struggling with diet control; made some improvements like changing to decaf coffee but feels she is not strict about diet at all. Of note, did not do 3 days of voice rest (she says she talks a lot and cannot get 3 days to get voice rest though she joked that her husband would love it. 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       IOV 03/08/2016  Chief Complaint  Patient presents with  . Pulmonary Consult    Pt referred by Dr. Jani Gravel for chronic cough. Pt states her cough has worsened in the past week d/t recent cold. Pt states the cough is productive with pink mucus in morning and white mucus throughout the day. Pt c/o DOE, sinus congestion.     55 year old female last seen over 3  years ago there for this and you will office consult. For by Dr. Jani Gravel. History is gained from talking to the patient and review of the chart. She reports running cough all her life. Insidious onset. No clearcut starting point. It has been there for several years. Maybe even several decades. Overall stable. Last seen by myself in 2012. Cough overall has not changed since then except for the past 1-2 weeks she's having some acute bronchitis worsening and current RSI cough score below is a reflection of the current worsening. But at baseline she has cough on a daily basis. The cough does not bother her at night except when she is sick with acute bronchitis. The cough is made worse  by cold weather warm temperature and occasionally when eating. The cough is improved by drinking cold water. She has water with her all the time. Talking a lot makes it worse. It is associated with a tickle in the throat. She clears her throat and is constant postnasal drip.  Prior trials of acid reflux therapy and allergy testing with Dr. Hardie Pulley and PPI therapy with Dr. Paulita Fujita in the last few years according to history of not helped. She had a maxillofacial CT May 2015 which I personally visualized and is reported as normal.last chest x-ray was in 2012 that I personally visualized and it looks clear to me.  Current therapies include nasal steroid, antihistamine, Singulair and Qvar which she is not sure is helping or not. 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   Dr Lorenza Cambridge Reflux Symptom Index (> 13-15 suggestive of LPR cough) 03/08/2016  -x 1 week with acute on chronic  Hoarseness of problem with voice 4  Clearing  Of Throat 4  Excess throat mucus or feeling of post nasal drip 5  Difficulty swallowing food, liquid or tablets 1  Cough after eating or lying down 4  Breathing difficulties or choking episodes 3  Troublesome or annoying cough 5  Sensation of something sticking in throat or lump in throat 1  Heartburn, chest pain, indigestion, or stomach acid coming up 1  TOTAL 28      has a past medical history of ALLERGIC RHINITIS and Chronic cough.   reports that she has never smoked. She has never used smokeless tobacco.  Past Surgical History  Procedure Laterality Date  . Oophorectomy  age 43    left ovary  . Tonsillectomy      Allergies  Allergen Reactions  . Codeine Nausea And Vomiting    Immunization History  Administered Date(s) Administered  . Influenza Split 09/26/2011    Family History  Problem Relation Age of Onset  . Asthma Mother   . Pulmonary  fibrosis Father      Current outpatient prescriptions:  .  amoxicillin-clavulanate (AUGMENTIN) 875-125 MG tablet, Take 1 tablet by mouth 2 (two) times daily., Disp: , Rfl:  .  beclomethasone (QVAR) 80 MCG/ACT inhaler, Inhale 1 puff into the lungs 2 (two) times daily., Disp: , Rfl:  .  Beclomethasone Dipropionate (QNASL) 80 MCG/ACT AERS, Place 2 puffs into the nose daily.  , Disp: , Rfl:  .  dextromethorphan-guaiFENesin (MUCINEX DM) 30-600 MG 12hr tablet, Take 1 tablet by mouth 2 (two) times daily., Disp: , Rfl:  .  Efinaconazole (JUBLIA) 10 % SOLN, Apply topically., Disp: , Rfl:  .  levocetirizine (XYZAL) 5  MG tablet, Take 5 mg by mouth every evening., Disp: , Rfl:  .  montelukast (SINGULAIR) 10 MG tablet, Take 10 mg by mouth at bedtime., Disp: , Rfl:  .  Multiple Vitamins-Minerals (MULTIVITAMIN WITH MINERALS) tablet, Take 1 tablet by mouth daily.  , Disp: , Rfl:  .  Omeprazole-Sodium Bicarbonate (ZEGERID) 20-1100 MG CAPS, Take 1 capsule by mouth daily before breakfast.  , Disp: , Rfl:  .  gabapentin (NEURONTIN) 300 MG capsule, Take 1 tablet daily x 5 days, then 1 tablet twice daily x 5 days, then 1 tablet three times daily and continue., Disp: 90 capsule, Rfl: 3     Review of Systems  Constitutional: Negative for fever and unexpected weight change.  HENT: Positive for congestion. Negative for dental problem, ear pain, nosebleeds, postnasal drip, rhinorrhea, sinus pressure, sneezing, sore throat and trouble swallowing.   Eyes: Negative for redness and itching.  Respiratory: Positive for cough. Negative for chest tightness, shortness of breath and wheezing.   Cardiovascular: Negative for palpitations and leg swelling.  Gastrointestinal: Negative for nausea and vomiting.  Genitourinary: Negative for dysuria.  Musculoskeletal: Negative for joint swelling.  Skin: Negative for rash.  Neurological: Negative for headaches.  Hematological: Does not bruise/bleed easily.  Psychiatric/Behavioral:  Negative for dysphoric mood. The patient is not nervous/anxious.        Objective:   Physical Exam  Constitutional: She is oriented to person, place, and time. She appears well-developed and well-nourished. No distress.  HENT:  Head: Normocephalic and atraumatic.  Right Ear: External ear normal.  Left Ear: External ear normal.  Mouth/Throat: Oropharynx is clear and moist. No oropharyngeal exudate.  Eyes: Conjunctivae and EOM are normal. Pupils are equal, round, and reactive to light. Right eye exhibits no discharge. Left eye exhibits no discharge. No scleral icterus.  Neck: Normal range of motion. Neck supple. No JVD present. No tracheal deviation present. No thyromegaly present.  Cardiovascular: Normal rate, regular rhythm, normal heart sounds and intact distal pulses.  Exam reveals no gallop and no friction rub.   No murmur heard. Pulmonary/Chest: Effort normal and breath sounds normal. No respiratory distress. She has no wheezes. She has no rales. She exhibits no tenderness.  Abdominal: Soft. Bowel sounds are normal. She exhibits no distension and no mass. There is no tenderness. There is no rebound and no guarding.  Musculoskeletal: Normal range of motion. She exhibits no edema or tenderness.  Lymphadenopathy:    She has no cervical adenopathy.  Neurological: She is alert and oriented to person, place, and time. She has normal reflexes. No cranial nerve deficit. She exhibits normal muscle tone. Coordination normal.  Skin: Skin is warm and dry. No rash noted. She is not diaphoretic. No erythema. No pallor.  Psychiatric: She has a normal mood and affect. Her behavior is normal. Judgment and thought content normal.  Vitals reviewed.   Filed Vitals:   03/08/16 1556  BP: 146/96  Pulse: 89  Height: 5\' 3"  (1.6 m)  Weight: 200 lb 6.4 oz (90.901 kg)  SpO2: 95%          Assessment & Plan:     ICD-9-CM ICD-10-CM   1. Chronic cough 786.2 R05   2. 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 Never had CT chest. I explained all of above  Plan   Do HRCT chest - will call with result after 03/20/16  Take gabapentin 300mg  once daily x 5 days,  then 300mg  twice daily x 5 days, then 300mg  three times daily to continue. If this makes you too sleepy or drowsy call us and we will cut your medication dosing down  REfer Mr Garald Balding voice neuro rehab  Take 2 days total voice rest  Suck on sugarless lozenge to break cough cycle and drink plenty of water  Hold off prednisone due to normal FeNO   Continue therapies given by PCP Jani Gravel, MD   Followup -  6-8 weeks to review progress  - cough score at followup     Dr. Brand Males, M.D., [facility].C.P Pulmonary and Critical Care Medicine Staff Physician [place] [facility] Pager: [phone], If no answer or between  15:00h - 7:00h: call [phone]  03/08/2016 4:47 PM

## 2016-03-08 NOTE — Patient Instructions (Signed)
ICD-9-CM ICD-10-CM   1. Chronic cough 786.2 R05   2. Irritable larynx 478.79 J38.7     Do HRCT chest - will call with result after 03/20/16  Take gabapentin 300mg  once daily x 5 days, then 300mg  twice daily x 5 days, then 300mg  three times daily to continue. If this makes you too sleepy or drowsy call us and we will cut your medication dosing down  REfer Charlotte Roth voice neuro rehab  Take 2 days total voice rest  Suck on sugarless lozenge to break cough cycle and drink plenty of water  Continue therapies given by PCP Jani Gravel, MD   Followup -  6-8 weeks to review progress  - cough score at followup

## 2016-03-21 ENCOUNTER — Ambulatory Visit (INDEPENDENT_AMBULATORY_CARE_PROVIDER_SITE_OTHER)
Admission: RE | Admit: 2016-03-21 | Discharge: 2016-03-21 | Disposition: A | Discharge status: Home / Self Care | Payer: BLUE CROSS/BLUE SHIELD | Source: Ambulatory Visit | Attending: Internal Medicine | Admitting: Internal Medicine

## 2016-03-21 DIAGNOSIS — J387 Other diseases of larynx: Secondary | ICD-10-CM | POA: Diagnosis not present

## 2016-03-21 DIAGNOSIS — R05 Cough: Secondary | ICD-10-CM

## 2016-03-21 DIAGNOSIS — R053 Chronic cough: Secondary | ICD-10-CM

## 2016-03-22 ENCOUNTER — Telehealth: Payer: Self-pay | Admitting: Internal Medicine

## 2016-03-22 DIAGNOSIS — J849 Interstitial pulmonary disease, unspecified: Secondary | ICD-10-CM

## 2016-03-22 NOTE — Telephone Encounter (Signed)
Seeing her in may for fu of cough. HRCT shows ILD - give her result. ALso sometiime between now and a week before seeing me have her do Serum: ESR, ACE, ANA, DS-DNA, RF, anti-CCP, ssA, ssB, scl-70, ANCA screen, MPO, PR-3, Total CK,  RNP, Aldolase,  Hypersensitivity Pneumonitis Panel

## 2016-03-24 NOTE — Telephone Encounter (Signed)
lmtcb for pt.  

## 2016-03-24 NOTE — Telephone Encounter (Signed)
Spoke with pt and advised of Dr Alta Corning results and recommendations.  Lab orders placed. Pt has appt with MR on 5/18 but would like for MR to call her to discuss this briefly prior to then.  Number to call is [phone].

## 2016-03-25 NOTE — Telephone Encounter (Signed)
D//w patient. Her dad died of Pulm fibrosis and she got scared. Explaind ILD but currently inflmmatory stage. Possible recent infection has impact on CT. Advised we could wait on autoimmune but she wanted to go through it now. Told her to do it atleast5-7d before next ov. She agreed.

## 2016-04-04 DIAGNOSIS — H00025 Hordeolum internum left lower eyelid: Secondary | ICD-10-CM | POA: Diagnosis not present

## 2016-04-07 ENCOUNTER — Other Ambulatory Visit (INDEPENDENT_AMBULATORY_CARE_PROVIDER_SITE_OTHER): Payer: BLUE CROSS/BLUE SHIELD

## 2016-04-07 DIAGNOSIS — J849 Interstitial pulmonary disease, unspecified: Secondary | ICD-10-CM | POA: Diagnosis not present

## 2016-04-07 LAB — RHEUMATOID FACTOR

## 2016-04-07 LAB — SEDIMENTATION RATE: Sed Rate: 19 mm/hr (ref 0–22)

## 2016-04-08 LAB — ANTI-SCLERODERMA ANTIBODY: Scleroderma (Scl-70) (ENA) Antibody, IgG: <1

## 2016-04-08 LAB — SJOGRENS SYNDROME-B EXTRACTABLE NUCLEAR ANTIBODY: SSB (LA) (ENA) ANTIBODY, IGG: NEGATIVE

## 2016-04-08 LAB — ANTI-NUCLEAR AB-TITER (ANA TITER): ANA Titer 1: 1:40 {titer} — ABNORMAL HIGH

## 2016-04-08 LAB — CK TOTAL AND CKMB (NOT AT ARMC): CK TOTAL: 56 U/L (ref 7–177)

## 2016-04-08 LAB — ANA: ANA: POSITIVE — AB

## 2016-04-08 LAB — MPO/PR-3 (ANCA) ANTIBODIES: Serine Protease 3: <1

## 2016-04-08 LAB — ANGIOTENSIN CONVERTING ENZYME: ANGIOTENSIN-CONVERTING ENZYME: 48 U/L (ref 8–52)

## 2016-04-08 LAB — CYCLIC CITRUL PEPTIDE ANTIBODY, IGG: Cyclic Citrullin Peptide Ab: <16 Units

## 2016-04-08 LAB — SJOGRENS SYNDROME-A EXTRACTABLE NUCLEAR ANTIBODY: SSA (RO) (ENA) ANTIBODY, IGG: NEGATIVE

## 2016-04-08 LAB — RNP ANTIBODY: Ribonucleic Protein(ENA) Antibody, IgG: <1

## 2016-04-08 LAB — ANTI-DNA ANTIBODY, DOUBLE-STRANDED

## 2016-04-11 LAB — ALDOLASE: Aldolase: 5.2 U/L (ref <=8.1)

## 2016-04-11 LAB — ANCA SCREEN W REFLEX TITER: ANCA SCREEN: NEGATIVE

## 2016-04-12 LAB — HYPERSENSITIVITY PNUEMONITIS PROFILE

## 2016-04-21 ENCOUNTER — Ambulatory Visit (INDEPENDENT_AMBULATORY_CARE_PROVIDER_SITE_OTHER): Payer: BLUE CROSS/BLUE SHIELD | Admitting: Internal Medicine

## 2016-04-21 ENCOUNTER — Encounter: Payer: Self-pay | Admitting: Internal Medicine

## 2016-04-21 VITALS — BP 122/72 | HR 79 | Ht 63.0 in | Wt 200.0 lb

## 2016-04-21 DIAGNOSIS — J849 Interstitial pulmonary disease, unspecified: Secondary | ICD-10-CM | POA: Diagnosis not present

## 2016-04-21 DIAGNOSIS — J387 Other diseases of larynx: Secondary | ICD-10-CM

## 2016-04-21 DIAGNOSIS — R05 Cough: Secondary | ICD-10-CM | POA: Diagnosis not present

## 2016-04-21 DIAGNOSIS — R053 Chronic cough: Secondary | ICD-10-CM

## 2016-04-21 NOTE — Patient Instructions (Addendum)
ICD-9-CM ICD-10-CM   1. Irritable larynx 478.79 J38.7   2. Chronic cough 786.2 R05   3. ILD (interstitial lung disease) ([place]) 515 J84.9     Glad you cough is much better Continue gabapentin 1 tablet 2 times daily  - At follow-up we will take measures to reduce it depending on response Re-refer to neuro rehabilitation voice therapy Mr Charlotte Roth  - Definitely helpful Every time he have the urge to cough please drink water or suck on sugarless lozenge Always try talking less Continue other measures  - For sinus stop Q nasal and try changing to generic Flonase; if epistaxis persists then please talk to primary care physician or see ENT  Re: Interstitial lung disease  -We are not sure if it is generally there or not  - Autoimmune blood work is negative   Repeat high-resolution CT chest without contrast supine and prone in 3 months-  - Do full pulmonary function test in 3 months  Follow-up  - Pulmonary function test and CT chest in 3 months and then return to see me  - RSI cough score at follow-up

## 2016-04-21 NOTE — Progress Notes (Signed)
Subjective:     Patient ID: Charlotte Roth, female   DOB: 1961-09-10, 55 y.o.   MRN: [id number]  HPI  Followup Multifactorial cough  OV 06/28/11: Last visit 05/12/11. Multifactorial interventions done - LPR cough with sinus, gerd issues. Following interventions, cough 60% better in terms of severity and frequency. Cough still dry. GAggin and throwing up at night present but reduced. Postnasal drip reduced but still present. Still socially embarrassing. 2 tabs chlorphen making her dry; > 90% compliant. Takes sudafed. Finds QNASL wonderful and best help. However, having difficulty with GERD precatuions especially the diet sheet and zegerid compliance. Notes that coming off zegerid x 3 days, made cough worse. Did have methacholine challenget test - negative on 05/18/11 for asthma.   No new issues  Your chronic cough is probably combination of sinus drainage, possible Acid reflux, possible cyclical cough  # for sinus  - continue chlorpheniramine 4mg  x 2 tablets each night - if this makes you too sleepy or dry back off to 1 tablet daily  - take sudafed 12h XR tablet x 1 daily  - continue QNASL 2 squirts each nostril daily - take 2 samples and prescription - iff too expensive call us  # for acid reflux  - take diet sheet again and follow all we discussed  - take over the counter zegerid 20mg  capsule once each morning on empty stomach  - do not go back to fish oil  # for possible cyclical cough  - suck on lozenges or sugarless mint or drink water each time you have an urge to cough  - talke 3 days off for complete voice rest - no whispering or talking - in next 2-3 weeks  #FOLLOWUP  - important you follow each advice 100%  - return in 6 weeks  - depending on course CT scan    OV 08/17/11: Followup for above. States that cough imprved till week ago but past week cough is worse. States significantly worse - worse than prior ov in July but not as bad as first time she saw me (2 visits ago). Currently  rates cough as moderate. Cough currently productive - whitish and orange (before it was clear). No associated wheezing but gags + when cough present. RSI cough score is 12 of 35 which is below the cut off for LPR cough but then she is on partial Rx. Significant associated post nasal drainage persists; this is worse. Post nsal drip worse despite taking 2 tabs chlorphen at night, morning sudafed and daily QNASL for which she is compliant. In terms of GERD control: compliant with zegerid which she is compliant. But is struggling with diet control; made some improvements like changing to decaf coffee but feels she is not strict about diet at all. Of note, did not do 3 days of voice rest (she says she talks a lot and cannot get 3 days to get voice rest though she joked that her husband would love it. 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       IOV 03/08/2016  Chief Complaint  Patient presents with  . Pulmonary Consult    Pt referred by Dr. Jani Gravel for chronic cough. Pt states her cough has worsened in the past week d/t recent cold. Pt states the cough is productive with pink mucus in morning and white mucus throughout the day. Pt c/o DOE, sinus congestion.     55 year old female last seen over 3 years ago there for this and  you will office consult. For by Dr. Jani Gravel. History is gained from talking to the patient and review of the chart. She reports running cough all her life. Insidious onset. No clearcut starting point. It has been there for several years. Maybe even several decades. Overall stable. Last seen by myself in 2012. Cough overall has not changed since then except for the past 1-2 weeks she's having some acute bronchitis worsening and current RSI cough score below is a reflection of the current worsening. But at baseline she has cough on a daily basis. The cough does not bother her at night except when she is sick with acute bronchitis. The cough is made worse by cold weather warm  temperature and occasionally when eating. The cough is improved by drinking cold water. She has water with her all the time. Talking a lot makes it worse. It is associated with a tickle in the throat. She clears her throat and is constant postnasal drip.  Prior trials of acid reflux therapy and allergy testing with Dr. Hardie Pulley and PPI therapy with Dr. Paulita Fujita in the last few years according to history of not helped. She had a maxillofacial CT May 2015 which I personally visualized and is reported as normal.last chest x-ray was in 2012 that I personally visualized and it looks clear to me.  Current therapies include nasal steroid, antihistamine, Singulair and Qvar which she is not sure is helping or not. 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 Follow-up    Pt states that her cough has improved with gabapentin. She can only take 2 per day due to side effects. Pt reports occasional cough. Pt denies wheeze/SOB/CP/tightness. Pt would like to try alternative to Qnasl due to side effects.     Follow-up chronic cough. She tells me that she is significantly better after started her on gabapentin. She is unable to tolerate intervention twice daily. At last visit she reports a cough intensity is 8 out of 10 but at this visit is only 3 out of 10. Both husband and coworkers fine significant improvement. She's had a few significant episodes of cough. Talking at work makes her cough significantly work worse again and we did her cough scoring it appears that it is irritable larynx or cough neuropathy that is a significant flare in her cough. She again says that she coughs most of the day. Temperature changes make her cough. Laughing makes her cough. Smells make her cough. Speaking in singing make her cough. But overall she is better.  As  part of her cough workup we did a high-resolution CT chest. This was done in mid April 2017. There are findings of interstitial lung disease nSip pattern. I gave her these results over the phone. She got extremely worried because her father died from pulmonary fibrosis. She told me at that time she did have flu like illness and she wonders if he findings of because of those. Nevertheless she wanted to have an autoimmune workup which we did 04/07/2016 along with hypersensitive pneumonitis panel. This is negative except for ANA which is trace positive at 1:40.     Dr Lorenza [place] Reflux Symptom Index (> 13-15 suggestive of LPR cough) 03/08/2016  -x 1 week with acute on chronic  Hoarseness of problem with  voice 4  Clearing  Of Throat 4  Excess throat mucus or feeling of post nasal drip 5  Difficulty swallowing food, liquid or tablets 1  Cough after eating or lying down 4  Breathing difficulties or choking episodes 3  Troublesome or annoying cough 5  Sensation of something sticking in throat or lump in throat 1  Heartburn, chest pain, indigestion, or stomach acid coming up 1  TOTAL 28      Current outpatient prescriptions:  .  beclomethasone (QVAR) 80 MCG/ACT inhaler, Inhale 1 puff into the lungs 2 (two) times daily., Disp: , Rfl:  .  Beclomethasone Dipropionate (QNASL) 80 MCG/ACT AERS, Place 2 puffs into the nose daily.  , Disp: , Rfl:  .  dextromethorphan-guaiFENesin (MUCINEX DM) 30-600 MG 12hr tablet, Take 1 tablet by mouth 2 (two) times daily., Disp: , Rfl:  .  Efinaconazole (JUBLIA) 10 % SOLN, Apply topically., Disp: , Rfl:  .  gabapentin (NEURONTIN) 300 MG capsule, 300mg  once daily x 5 days, then 300mg  twice daily x 5 days, then 300mg  three times daily to continue, Disp: 90 capsule, Rfl: 5 .  levocetirizine (XYZAL) 5 MG tablet, Take 5 mg by mouth every evening., Disp: , Rfl:  .  montelukast (SINGULAIR) 10 MG tablet, Take 10 mg by mouth at bedtime., Disp: , Rfl:  .  Multiple Vitamins-Minerals  (MULTIVITAMIN WITH MINERALS) tablet, Take 1 tablet by mouth daily.  , Disp: , Rfl:  .  Omeprazole-Sodium Bicarbonate (ZEGERID) 20-1100 MG CAPS, Take 1 capsule by mouth daily before breakfast.  , Disp: , Rfl:  .  PROAIR HFA 108 (90 Base) MCG/ACT inhaler, Inhale 2 puffs into the lungs as needed., Disp: , Rfl: 1  Allergies  Allergen Reactions  . Codeine Nausea And Vomiting    Immunization History  Administered Date(s) Administered  . Influenza Split 09/26/2011     Review of Systems     Objective:   Physical Exam  Constitutional: She is oriented to person, place, and time. She appears well-developed and well-nourished. No distress.  HENT:  Head: Normocephalic and atraumatic.  Right Ear: External ear normal.  Left Ear: External ear normal.  Mouth/Throat: Oropharynx is clear and moist. No oropharyngeal exudate.  Eyes: Conjunctivae and EOM are normal. Pupils are equal, round, and reactive to light. Right eye exhibits no discharge. Left eye exhibits no discharge. No scleral icterus.  Neck: Normal range of motion. Neck supple. No JVD present. No tracheal deviation present. No thyromegaly present.  Cardiovascular: Normal rate, regular rhythm, normal heart sounds and intact distal pulses.  Exam reveals no gallop and no friction rub.   No murmur heard. Pulmonary/Chest: Effort normal and breath sounds normal. No respiratory distress. She has no wheezes. She has no rales. She exhibits no tenderness.  ? rales  Abdominal: Soft. Bowel sounds are normal. She exhibits no distension and no mass. There is no tenderness. There is no rebound and no guarding.  Musculoskeletal: Normal range of motion. She exhibits no edema or tenderness.  Lymphadenopathy:    She has no cervical adenopathy.  Neurological: She is alert and oriented to person, place, and time. She has normal reflexes. No cranial nerve deficit. She exhibits normal muscle tone. Coordination normal.  Skin: Skin is warm and dry. No rash noted.  She is not diaphoretic. No erythema. No pallor.  Psychiatric: She has a normal mood and affect. Her behavior is normal. Judgment and thought content normal.  Vitals reviewed.   Filed Vitals:   04/21/16 [id number]  BP: 122/72  Pulse: 79  Height: 5\' 3"  (1.6 m)  Weight: 200 lb (90.719 kg)  SpO2: 97%        Assessment:     *   ICD-9-CM ICD-10-CM   1. Irritable larynx 478.79 J38.7   2. Chronic cough 786.2 R05   3. ILD (interstitial lung disease) ([place]) 515 J84.9        Plan:       Glad you cough is much better Continue gabapentin 1 tablet 2 times daily  - At follow-up we will take measures to reduce it depending on response Re-refer to neuro rehabilitation voice therapy Mr Valentino Saxon  - Definitely helpful Every time he have the urge to cough please drink water or suck on sugarless lozenge Always try talking less Continue other measures  - For sinus stop Q nasal and try changing to generic Flonase; if epistaxis persists then please talk to primary care physician or see ENT  Re: Interstitial lung disease  -We are not sure if it is generally there or not  - Autoimmune blood work is negative   Repeat high-resolution CT chest without contrast supine and prone in 3 months-  - Do full pulmonary function test in 3 months  Follow-up  - Pulmonary function test and CT chest in 3 months and then return to see me  - RSI cough score at follow-up   > 50% of this > 25 min visit spent in face to face counseling or coordination of care    Dr. Brand Males, M.D., [facility].C.P Pulmonary and Critical Care Medicine Staff Physician [facility] Pager: [phone], If no answer or between  15:00h - 7:00h: call [phone]  04/21/2016 10:39 AM

## 2016-04-22 ENCOUNTER — Telehealth: Payer: Self-pay | Admitting: Internal Medicine

## 2016-04-22 NOTE — Telephone Encounter (Signed)
lmtcb x1 for pt. 

## 2016-04-25 MED ORDER — FLUTICASONE PROPIONATE 50 MCG/ACT NA SUSP: 2.0000 | Freq: Every day | NASAL | Status: DC

## 2016-04-25 NOTE — Telephone Encounter (Signed)
lmtcb X2 for pt.  rx sent to pharmacy

## 2016-04-26 NOTE — Telephone Encounter (Signed)
Patient returned our call - informed her that rx was sent to pharmacy - pt voiced understanding -prm

## 2016-06-28 ENCOUNTER — Ambulatory Visit (INDEPENDENT_AMBULATORY_CARE_PROVIDER_SITE_OTHER)
Admission: RE | Admit: 2016-06-28 | Discharge: 2016-06-28 | Disposition: A | Discharge status: Home / Self Care | Payer: BLUE CROSS/BLUE SHIELD | Source: Ambulatory Visit | Attending: Internal Medicine | Admitting: Internal Medicine

## 2016-06-28 DIAGNOSIS — J849 Interstitial pulmonary disease, unspecified: Secondary | ICD-10-CM

## 2016-06-28 DIAGNOSIS — E559 Vitamin D deficiency, unspecified: Secondary | ICD-10-CM | POA: Diagnosis not present

## 2016-06-28 DIAGNOSIS — E785 Hyperlipidemia, unspecified: Secondary | ICD-10-CM | POA: Diagnosis not present

## 2016-07-01 ENCOUNTER — Telehealth: Payer: Self-pay | Admitting: Internal Medicine

## 2016-07-01 NOTE — Telephone Encounter (Signed)
Ct shows clearance of ILd findings     Ct Chest High Resolution  Result Date: 06/29/2016 CLINICAL DATA:  56 year old female with possible mild mild interstitial lung disease noted on prior chest CT. Followup study. EXAM: CT CHEST WITHOUT CONTRAST TECHNIQUE: Multidetector CT imaging of the chest was performed following the standard protocol without intravenous contrast. High resolution imaging of the lungs, as well as inspiratory and expiratory imaging, was performed. COMPARISON:  High-resolution chest CT 03/21/2016. FINDINGS: Cardiovascular: Heart size is normal. There is no significant pericardial fluid, thickening or pericardial calcification. No atherosclerotic calcifications are noted in the thoracic aorta or the coronary arteries. Mediastinum/Nodes: No pathologically enlarged mediastinal or hilar lymph nodes. Please note that accurate exclusion of hilar adenopathy is limited on noncontrast CT scans. Esophagus is unremarkable in appearance. No axillary lymphadenopathy. Lungs/Pleura: High-resolution images demonstrate no significant regions of ground-glass attenuation, subpleural reticulation, parenchymal banding, traction bronchiectasis or frank honeycombing. Inspiratory and expiratory imaging demonstrates minimal air trapping, indicative of mild small airways disease. No acute consolidative airspace disease. No pleural effusions. No suspicious appearing pulmonary nodules or masses. Upper Abdomen: Unremarkable. Musculoskeletal: There are no aggressive appearing lytic or blastic lesions noted in the visualized portions of the skeleton. IMPRESSION: 1. Subtle changes noted on the prior study have resolved on today's examination, and are not indicative of underlying interstitial lung disease. There are no findings on today's study to suggest interstitial lung disease. 2. Minimal air trapping, indicative of mild small airways disease. Electronically Signed   By: Vinnie Langton M.D.   On: 06/29/2016  08:42

## 2016-07-01 NOTE — Telephone Encounter (Signed)
lmtcb

## 2016-07-04 NOTE — Telephone Encounter (Signed)
Pt returning call and can be reached @ [phone].Charlotte Roth

## 2016-07-04 NOTE — Telephone Encounter (Signed)
Spoke with pt. She is aware of results. Nothing further was needed.  

## 2016-07-25 ENCOUNTER — Encounter: Payer: Self-pay | Admitting: Internal Medicine

## 2016-07-25 ENCOUNTER — Ambulatory Visit (INDEPENDENT_AMBULATORY_CARE_PROVIDER_SITE_OTHER): Payer: BLUE CROSS/BLUE SHIELD | Admitting: Internal Medicine

## 2016-07-25 VITALS — BP 142/88 | HR 77 | Ht 64.0 in | Wt 205.0 lb

## 2016-07-25 DIAGNOSIS — R05 Cough: Secondary | ICD-10-CM

## 2016-07-25 DIAGNOSIS — J387 Other diseases of larynx: Secondary | ICD-10-CM

## 2016-07-25 DIAGNOSIS — R0982 Postnasal drip: Secondary | ICD-10-CM | POA: Diagnosis not present

## 2016-07-25 DIAGNOSIS — J849 Interstitial pulmonary disease, unspecified: Secondary | ICD-10-CM

## 2016-07-25 DIAGNOSIS — K219 Gastro-esophageal reflux disease without esophagitis: Secondary | ICD-10-CM | POA: Insufficient documentation

## 2016-07-25 DIAGNOSIS — R053 Chronic cough: Secondary | ICD-10-CM

## 2016-07-25 LAB — PULMONARY FUNCTION TEST
DL/VA % PRED: 97 %
DL/VA: 4.66 ml/min/mmHg/L
DLCO UNC % PRED: 86 %
DLCO UNC: 20.97 ml/min/mmHg
DLCO cor % pred: 84 %
DLCO cor: 20.6 ml/min/mmHg
FEF 25-75 PRE: 3.05 L/s
FEF 25-75 Post: 3.39 L/sec
FEF2575-%Change-Post: 11 %
FEF2575-%Pred-Post: 130 %
FEF2575-%Pred-Pre: 117 %
FEV1-%Change-Post: 8 %
FEV1-%PRED-POST: 99 %
FEV1-%PRED-PRE: 91 %
FEV1-PRE: 2.48 L
FEV1-Post: 2.7 L
FEV1FVC-%Change-Post: 5 %
FEV1FVC-%Pred-Pre: 108 %
FEV6-%CHANGE-POST: 4 %
FEV6-%PRED-POST: 88 %
FEV6-%PRED-PRE: 85 %
FEV6-PRE: 2.86 L
FEV6-Post: 2.98 L
FEV6FVC-%CHANGE-POST: 0 %
FEV6FVC-%PRED-PRE: 102 %
FEV6FVC-%Pred-Post: 103 %
FVC-%Change-Post: 3 %
FVC-%Pred-Post: 86 %
FVC-%Pred-Pre: 83 %
FVC-Post: 2.98 L
FVC-Pre: 2.89 L
POST FEV1/FVC RATIO: 91 %
POST FEV6/FVC RATIO: 100 %
Pre FEV1/FVC ratio: 86 %
Pre FEV6/FVC Ratio: 99 %
RV % PRED: 98 %
RV: 1.84 L
TLC % PRED: 99 %
TLC: 5.01 L

## 2016-07-25 NOTE — Patient Instructions (Addendum)
ICD-9-CM ICD-10-CM   1. Chronic cough 786.2 R05   2. Irritable larynx 478.79 J38.7   3. Post-nasal drip 784.91 R09.82   4. Gastroesophageal reflux disease, esophagitis presence not specified 530.81 K21.9      Glad you cough is much better Continue gabapentin 1 tablet but reduce from 2 times daily to 1 tablet at bedtime  - At follow-up we will take measures to reduce it depending on response Strongly still recommend  neuro rehabilitation voice therapy Mr Valentino Saxon  - Definitely helpful but due to possible insurance issues will hold off Every time he have the urge to cough please drink water or suck on sugarless lozenge Always try talking less For sinus drip that is significant   - refer Dr Elgie Congo ENT For Acid Reflux  - refer Dr Paulita Fujita of GI For Interstitial lung disease  -you do not have this based on repeat ct chest July 2017 and PFT 07/25/2016  For asthma  - continue advice of Dr Orvil Feil ; in future we can consider tapering this based on her input and feno testing  Follow-up  - 64month or sooner if needed  - flu shot in fall recommended

## 2016-07-25 NOTE — Progress Notes (Signed)
Subjective:     Patient ID: Charlotte Roth, female   DOB: 03-01-1961, 55 y.o.   MRN: [id number]  HPI Followup Multifactorial cough  OV 06/28/11: Last visit 05/12/11. Multifactorial interventions done - LPR cough with sinus, gerd issues. Following interventions, cough 60% better in terms of severity and frequency. Cough still dry. GAggin and throwing up at night present but reduced. Postnasal drip reduced but still present. Still socially embarrassing. 2 tabs chlorphen making her dry; > 90% compliant. Takes sudafed. Finds QNASL wonderful and best help. However, having difficulty with GERD precatuions especially the diet sheet and zegerid compliance. Notes that coming off zegerid x 3 days, made cough worse. Did have methacholine challenget test - negative on 05/18/11 for asthma.   No new issues  Your chronic cough is probably combination of sinus drainage, possible Acid reflux, possible cyclical cough  # for sinus  - continue chlorpheniramine 4mg  x 2 tablets each night - if this makes you too sleepy or dry back off to 1 tablet daily  - take sudafed 12h XR tablet x 1 daily  - continue QNASL 2 squirts each nostril daily - take 2 samples and prescription - iff too expensive call us  # for acid reflux  - take diet sheet again and follow all we discussed  - take over the counter zegerid 20mg  capsule once each morning on empty stomach  - do not go back to fish oil  # for possible cyclical cough  - suck on lozenges or sugarless mint or drink water each time you have an urge to cough  - talke 3 days off for complete voice rest - no whispering or talking - in next 2-3 weeks  #FOLLOWUP  - important you follow each advice 100%  - return in 6 weeks  - depending on course CT scan    OV 08/17/11: Followup for above. States that cough imprved till week ago but past week cough is worse. States significantly worse - worse than prior ov in July but not as bad as first time she saw me (2 visits ago). Currently  rates cough as moderate. Cough currently productive - whitish and orange (before it was clear). No associated wheezing but gags + when cough present. RSI cough score is 12 of 35 which is below the cut off for LPR cough but then she is on partial Rx. Significant associated post nasal drainage persists; this is worse. Post nsal drip worse despite taking 2 tabs chlorphen at night, morning sudafed and daily QNASL for which she is compliant. In terms of GERD control: compliant with zegerid which she is compliant. But is struggling with diet control; made some improvements like changing to decaf coffee but feels she is not strict about diet at all. Of note, did not do 3 days of voice rest (she says she talks a lot and cannot get 3 days to get voice rest though she joked that her husband would love it. 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       IOV 03/08/2016  Chief Complaint  Patient presents with  . Pulmonary Consult    Pt referred by Dr. Jani Gravel for chronic cough. Pt states her cough has worsened in the past week d/t recent cold. Pt states the cough is productive with pink mucus in morning and white mucus throughout the day. Pt c/o DOE, sinus congestion.     55 year old female last seen over 3 years ago there for this and you  will office consult. For by Dr. Jani Gravel. History is gained from talking to the patient and review of the chart. She reports running cough all her life. Insidious onset. No clearcut starting point. It has been there for several years. Maybe even several decades. Overall stable. Last seen by myself in 2012. Cough overall has not changed since then except for the past 1-2 weeks she's having some acute bronchitis worsening and current RSI cough score below is a reflection of the current worsening. But at baseline she has cough on a daily basis. The cough does not bother her at night except when she is sick with acute bronchitis. The cough is made worse by cold weather warm  temperature and occasionally when eating. The cough is improved by drinking cold water. She has water with her all the time. Talking a lot makes it worse. It is associated with a tickle in the throat. She clears her throat and is constant postnasal drip.  Prior trials of acid reflux therapy and allergy testing with Dr. Hardie Pulley and PPI therapy with Dr. Paulita Fujita in the last few years according to history of not helped. She had a maxillofacial CT May 2015 which I personally visualized and is reported as normal.last chest x-ray was in 2012 that I personally visualized and it looks clear to me.  Current therapies include nasal steroid, antihistamine, Singulair and Qvar which she is not sure is helping or not. 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 Follow-up    Pt states that her cough has improved with gabapentin. She can only take 2 per day due to side effects. Pt reports occasional cough. Pt denies wheeze/SOB/CP/tightness. Pt would like to try alternative to Qnasl due to side effects.     Follow-up chronic cough. She tells me that she is significantly better after started her on gabapentin. She is unable to tolerate intervention twice daily. At last visit she reports a cough intensity is 8 out of 10 but at this visit is only 3 out of 10. Both husband and coworkers fine significant improvement. She's had a few significant episodes of cough. Talking at work makes her cough significantly work worse again and we did her cough scoring it appears that it is irritable larynx or cough neuropathy that is a significant flare in her cough. She again says that she coughs most of the day. Temperature changes make her cough. Laughing makes her cough. Smells make her cough. Speaking in singing make her cough. But overall she is better.  As part  of her cough workup we did a high-resolution CT chest. This was done in mid April 2017. There are findings of interstitial lung disease nSip pattern. I gave her these results over the phone. She got extremely worried because her father died from pulmonary fibrosis. She told me at that time she did have flu like illness and she wonders if he findings of because of those. Nevertheless she wanted to have an autoimmune workup which we did 04/07/2016 along with hypersensitive pneumonitis panel. This is negative except for ANA which is trace positive at 1:40.   OV  07/25/2016  Chief Complaint  Patient presents with  . Follow-up    Pt here after PFT. Pt states the gabapentin has helped but her cough  has not yet fully resolved. Pt states she has her coughing spells after she eats.     Follow-up chronic cough. Since her last visit she is on gabapentin 2 times daily. Cough is improved. RSI cough score is 17 and shows a 50% improvement compared to April 2017. She had a follow-up CT chest high resolution July 2017 and this was normal. I personally visualized. She had a PFT today and it is normal pressure visualized the results. She is asking about continuing versus cutting down her gabapentin. Off note she did not attend neuro rehabilitation for her chronic cough. Currently even though she is better she has significant postnasal drainage according to her history. She also has cough after eating. She is rated both these symptoms at 5 out of 5. She's not been evaluated for these. She prefers to get evaluated.   Dr Lorenza Cambridge Reflux Symptom Index (> 13-15 suggestive of LPR cough) 03/08/2016  07/25/2016   Hoarseness of problem with voice 4 2  Clearing  Of Throat 4 2  Excess throat mucus or feeling of post nasal drip 5 5  Difficulty swallowing food, liquid or tablets 1 0  Cough after eating or lying down 4 5  Breathing difficulties or choking episodes 3 0  Troublesome or annoying cough 5 3  Sensation of something  sticking in throat or lump in throat 1 0  Heartburn, chest pain, indigestion, or stomach acid coming up 1 0  TOTAL 28 17       has a past medical history of ALLERGIC RHINITIS and Chronic cough.   reports that she has never smoked. She has never used smokeless tobacco.  Past Surgical History:  Procedure Laterality Date  . OOPHORECTOMY  age 15   left ovary  . TONSILLECTOMY      Allergies  Allergen Reactions  . Codeine Nausea And Vomiting    Immunization History  Administered Date(s) Administered  . Influenza Split 09/26/2011    Family History  Problem Relation Age of Onset  . Asthma Mother   . Pulmonary fibrosis Father      Current Outpatient Prescriptions:  .  beclomethasone (QVAR) 80 MCG/ACT inhaler, Inhale 1 puff into the lungs 2 (two) times daily., Disp: , Rfl:  .  Beclomethasone Dipropionate (QNASL) 80 MCG/ACT AERS, Place 2 puffs into the nose daily.  , Disp: , Rfl:  .  dextromethorphan-guaiFENesin (MUCINEX DM) 30-600 MG 12hr tablet, Take 1 tablet by mouth 2 (two) times daily., Disp: , Rfl:  .  gabapentin (NEURONTIN) 300 MG capsule, 300mg  once daily x 5 days, then 300mg  twice daily x 5 days, then 300mg  three times daily to continue, Disp: 90 capsule, Rfl: 5 .  levocetirizine (XYZAL) 5 MG tablet, Take 5 mg by mouth every evening., Disp: , Rfl:  .  montelukast (SINGULAIR) 10 MG tablet, Take 10 mg by mouth at bedtime., Disp: , Rfl:  .  Multiple Vitamins-Minerals (MULTIVITAMIN WITH MINERALS) tablet, Take 1 tablet by mouth daily.  , Disp: , Rfl:  .  Omeprazole-Sodium Bicarbonate (ZEGERID) 20-1100 MG CAPS, Take 1 capsule by mouth daily before breakfast.  , Disp: , Rfl:  .  PROAIR HFA 108 (90 Base) MCG/ACT inhaler, Inhale 2 puffs into the lungs as needed., Disp: , Rfl: 1   Review of Systems     Objective:   Physical Exam  Constitutional: She is oriented to person, place, and time. She appears well-developed and well-nourished. No distress.  HENT:  Head: Normocephalic  and atraumatic.  Right Ear:  External ear normal.  Left Ear: External ear normal.  Mouth/Throat: Oropharynx is clear and moist. No oropharyngeal exudate.  Postnasal drip present  Eyes: Conjunctivae and EOM are normal. Pupils are equal, round, and reactive to light. Right eye exhibits no discharge. Left eye exhibits no discharge. No scleral icterus.  Neck: Normal range of motion. Neck supple. No JVD present. No tracheal deviation present. No thyromegaly present.  Cardiovascular: Normal rate, regular rhythm, normal heart sounds and intact distal pulses.  Exam reveals no gallop and no friction rub.   No murmur heard. Pulmonary/Chest: Effort normal and breath sounds normal. No respiratory distress. She has no wheezes. She has no rales. She exhibits no tenderness.  Abdominal: Soft. Bowel sounds are normal. She exhibits no distension and no mass. There is no tenderness. There is no rebound and no guarding.  Musculoskeletal: Normal range of motion. She exhibits no edema or tenderness.  Lymphadenopathy:    She has no cervical adenopathy.  Neurological: She is alert and oriented to person, place, and time. She has normal reflexes. No cranial nerve deficit. She exhibits normal muscle tone. Coordination normal.  Skin: Skin is warm and dry. No rash noted. She is not diaphoretic. No erythema. No pallor.  Psychiatric: She has a normal mood and affect. Her behavior is normal. Judgment and thought content normal.  Vitals reviewed.   Vitals:   07/25/16 1111  BP: (!) 142/88  Pulse: 77  SpO2: 98%  Weight: 205 lb (93 kg)  Height: 5\' 4"  (1.626 m)        Assessment:       ICD-9-CM ICD-10-CM   1. Chronic cough 786.2 R05   2. Irritable larynx 478.79 J38.7   3. Post-nasal drip 784.91 R09.82   4. Gastroesophageal reflux disease, esophagitis presence not specified 530.81 K21.9        Plan:        Glad you cough is much better Continue gabapentin 1 tablet but reduce from 2 times daily to 1 tablet  at bedtime  - At follow-up we will take measures to reduce it depending on response Strongly still recommend  neuro rehabilitation voice therapy Mr Valentino Saxon  - Definitely helpful but due to possible insurance issues will hold off Every time he have the urge to cough please drink water or suck on sugarless lozenge Always try talking less For sinus drip that is significant   - refer Dr Elgie Congo ENT For Acid Reflux  - refer Dr Paulita Fujita of GI For Interstitial lung disease  -you do not have this based on repeat ct chest July 2017 and PFT 07/25/2016  For asthma  - continue advice of Dr Orvil Feil ; in future we can consider tapering this based on her input and feno testing  Follow-up  - 26month or sooner if needed  - flu shot in fall recommended  Dr. Brand Males, M.D., [facility].C.P Pulmonary and Critical Care Medicine Staff Physician [facility] Pager: [phone], If no answer or between  15:00h - 7:00h: call [phone]  07/25/2016 11:52 AM

## 2016-07-26 NOTE — Progress Notes (Signed)
pft testing 

## 2016-08-10 DIAGNOSIS — R05 Cough: Secondary | ICD-10-CM | POA: Diagnosis not present

## 2016-08-10 DIAGNOSIS — R490 Dysphonia: Secondary | ICD-10-CM | POA: Diagnosis not present

## 2016-09-05 DIAGNOSIS — R05 Cough: Secondary | ICD-10-CM | POA: Diagnosis not present

## 2016-09-12 ENCOUNTER — Ambulatory Visit (INDEPENDENT_AMBULATORY_CARE_PROVIDER_SITE_OTHER): Payer: BLUE CROSS/BLUE SHIELD | Admitting: Otolaryngology

## 2016-09-12 DIAGNOSIS — R05 Cough: Secondary | ICD-10-CM

## 2016-09-12 DIAGNOSIS — K219 Gastro-esophageal reflux disease without esophagitis: Secondary | ICD-10-CM | POA: Diagnosis not present

## 2016-10-05 DIAGNOSIS — J40 Bronchitis, not specified as acute or chronic: Secondary | ICD-10-CM

## 2016-10-05 HISTORY — DX: Bronchitis, not specified as acute or chronic: J40

## 2016-10-19 DIAGNOSIS — R05 Cough: Secondary | ICD-10-CM | POA: Diagnosis not present

## 2016-10-20 ENCOUNTER — Other Ambulatory Visit: Payer: Self-pay | Admitting: Gastroenterology

## 2016-10-21 ENCOUNTER — Encounter (HOSPITAL_COMMUNITY): Payer: Self-pay | Admitting: *Deleted

## 2016-10-24 ENCOUNTER — Encounter (HOSPITAL_COMMUNITY): Payer: Self-pay | Admitting: *Deleted

## 2016-10-25 ENCOUNTER — Ambulatory Visit: Payer: BLUE CROSS/BLUE SHIELD | Admitting: Internal Medicine

## 2016-10-31 DIAGNOSIS — J069 Acute upper respiratory infection, unspecified: Secondary | ICD-10-CM | POA: Diagnosis not present

## 2016-11-08 ENCOUNTER — Ambulatory Visit: Payer: BLUE CROSS/BLUE SHIELD | Admitting: Internal Medicine

## 2016-11-22 ENCOUNTER — Encounter (HOSPITAL_COMMUNITY): Payer: Self-pay

## 2016-11-22 ENCOUNTER — Other Ambulatory Visit: Payer: Self-pay | Admitting: Gastroenterology

## 2016-11-23 ENCOUNTER — Encounter (HOSPITAL_COMMUNITY)
Admission: RE | Disposition: A | Discharge status: Home / Self Care | Payer: Self-pay | Source: Ambulatory Visit | Attending: Gastroenterology

## 2016-11-23 ENCOUNTER — Encounter (HOSPITAL_COMMUNITY): Payer: Self-pay

## 2016-11-23 ENCOUNTER — Ambulatory Visit (HOSPITAL_COMMUNITY)
Admission: RE | Admit: 2016-11-23 | Discharge: 2016-11-23 | Disposition: A | Discharge status: Home / Self Care | Payer: BLUE CROSS/BLUE SHIELD | Source: Ambulatory Visit | Attending: Gastroenterology | Admitting: Gastroenterology

## 2016-11-23 ENCOUNTER — Ambulatory Visit (HOSPITAL_COMMUNITY): Payer: BLUE CROSS/BLUE SHIELD | Admitting: Anesthesiology

## 2016-11-23 DIAGNOSIS — R05 Cough: Secondary | ICD-10-CM | POA: Diagnosis not present

## 2016-11-23 DIAGNOSIS — K317 Polyp of stomach and duodenum: Secondary | ICD-10-CM | POA: Insufficient documentation

## 2016-11-23 DIAGNOSIS — Z79899 Other long term (current) drug therapy: Secondary | ICD-10-CM | POA: Diagnosis not present

## 2016-11-23 DIAGNOSIS — K219 Gastro-esophageal reflux disease without esophagitis: Secondary | ICD-10-CM | POA: Insufficient documentation

## 2016-11-23 DIAGNOSIS — R0982 Postnasal drip: Secondary | ICD-10-CM | POA: Diagnosis not present

## 2016-11-23 HISTORY — PX: BRAVO PH STUDY: SHX5421

## 2016-11-23 HISTORY — DX: Other specified postprocedural states: R11.2

## 2016-11-23 HISTORY — PX: ESOPHAGOGASTRODUODENOSCOPY (EGD) WITH PROPOFOL: SHX5813

## 2016-11-23 HISTORY — DX: Nausea with vomiting, unspecified: Z98.890

## 2016-11-23 HISTORY — DX: Unspecified asthma, uncomplicated: J45.909

## 2016-11-23 HISTORY — DX: Bronchitis, not specified as acute or chronic: J40

## 2016-11-23 HISTORY — DX: Gastro-esophageal reflux disease without esophagitis: K21.9

## 2016-11-23 SURGERY — ESOPHAGOGASTRODUODENOSCOPY (EGD) WITH PROPOFOL
Anesthesia: Monitor Anesthesia Care

## 2016-11-23 MED ORDER — SCOPOLAMINE 1 MG/3DAYS TD PT72
MEDICATED_PATCH | TRANSDERMAL | Status: AC
  Filled 2016-11-23: qty 1

## 2016-11-23 MED ORDER — SCOPOLAMINE 1 MG/3DAYS TD PT72
MEDICATED_PATCH | TRANSDERMAL | Status: DC | PRN
  Administered 2016-11-23: 400 via TRANSDERMAL
  Administered 2016-11-23: 1 via TRANSDERMAL

## 2016-11-23 MED ORDER — PROPOFOL 10 MG/ML IV BOLUS
INTRAVENOUS | Status: DC | PRN
  Administered 2016-11-23 (×2): 20 mg via INTRAVENOUS
  Administered 2016-11-23: 10 mg via INTRAVENOUS
  Administered 2016-11-23 (×7): 20 mg via INTRAVENOUS
  Administered 2016-11-23: 10 mg via INTRAVENOUS
  Administered 2016-11-23: 30 mg via INTRAVENOUS
  Administered 2016-11-23 (×2): 20 mg via INTRAVENOUS
  Administered 2016-11-23: 10 mg via INTRAVENOUS
  Administered 2016-11-23: 20 mg via INTRAVENOUS

## 2016-11-23 MED ORDER — LACTATED RINGERS IV SOLN
INTRAVENOUS | Status: DC
  Administered 2016-11-23: 09:00:00 via INTRAVENOUS

## 2016-11-23 MED ORDER — PROPOFOL 10 MG/ML IV BOLUS
INTRAVENOUS | Status: AC
Start: 2016-11-23 — End: 2016-11-23
  Filled 2016-11-23: qty 40

## 2016-11-23 SURGICAL SUPPLY — 15 items

## 2016-11-23 NOTE — Anesthesia Preprocedure Evaluation (Signed)
Anesthesia Evaluation  Patient identified by MRN, date of birth, ID band  Reviewed: Allergy & Precautions, NPO status , Patient's Chart, lab work & pertinent test results  History of Anesthesia Complications (+) PONV  Airway Mallampati: I  TM Distance: >3 FB Neck ROM: Full    Dental   Pulmonary    Pulmonary exam normal        Cardiovascular Normal cardiovascular exam     Neuro/Psych    GI/Hepatic GERD  Medicated and Controlled,  Endo/Other    Renal/GU      Musculoskeletal   Abdominal   Peds  Hematology   Anesthesia Other Findings   Reproductive/Obstetrics                             Anesthesia Physical Anesthesia Plan  ASA: II  Anesthesia Plan: MAC   Post-op Pain Management:    Induction: Intravenous  Airway Management Planned: Simple Face Mask  Additional Equipment:   Intra-op Plan:   Post-operative Plan:   Informed Consent: I have reviewed the patients History and Physical, chart, labs and discussed the procedure including the risks, benefits and alternatives for the proposed anesthesia with the patient or authorized representative who has indicated his/her understanding and acceptance.     Plan Discussed with: CRNA and Surgeon  Anesthesia Plan Comments:         Anesthesia Quick Evaluation

## 2016-11-23 NOTE — Transfer of Care (Signed)
Immediate Anesthesia Transfer of Care Note  Patient: Charlotte Roth  Procedure(s) Performed: Procedure(s): ESOPHAGOGASTRODUODENOSCOPY (EGD) WITH PROPOFOL (N/A) BRAVO PH STUDY (N/A)  Patient Location: PACU  Anesthesia Type:MAC  Level of Consciousness: sedated  Airway & Oxygen Therapy: Patient Spontanous Breathing and Patient connected to nasal cannula oxygen  Post-op Assessment: Report given to RN and Post -op Vital signs reviewed and stable  Post vital signs: Reviewed and stable  Last Vitals:  Vitals:   11/23/16 0833  BP: (!) 177/81  Pulse: 83  Resp: 19  Temp: 36.7 C    Last Pain:  Vitals:   11/23/16 0833  TempSrc: Oral         Complications: No apparent anesthesia complications

## 2016-11-23 NOTE — H&P (Signed)
Patient interval history reviewed.  Patient examined again.  There has been no change from documented H/P dated 11/22/16 (scanned into chart from our office) except as documented above.  Assessment:  1.  Chronic cough, concern for possible atypical GERD.  Plan:  1.  Endoscopy with pH capsule (Bravo) placement. 2.  Risks (bleeding, infection, bowel perforation that could require surgery, sedation-related changes in cardiopulmonary systems), benefits (identification and possible treatment of source of symptoms, exclusion of certain causes of symptoms), and alternatives (watchful waiting, radiographic imaging studies, empiric medical treatment) of upper endoscopy (EGD) were explained to patient/family in detail and patient wishes to proceed.

## 2016-11-23 NOTE — Anesthesia Postprocedure Evaluation (Signed)
Anesthesia Post Note  Patient: Seniah Moree  Procedure(s) Performed: Procedure(s) (LRB): ESOPHAGOGASTRODUODENOSCOPY (EGD) WITH PROPOFOL (N/A) BRAVO PH STUDY (N/A)  Patient location during evaluation: PACU Anesthesia Type: MAC Level of consciousness: awake and alert Pain management: pain level controlled Vital Signs Assessment: post-procedure vital signs reviewed and stable Respiratory status: spontaneous breathing, nonlabored ventilation, respiratory function stable and patient connected to nasal cannula oxygen Cardiovascular status: stable and blood pressure returned to baseline Anesthetic complications: no       Last Vitals:  Vitals:   11/23/16 0833 11/23/16 1000  BP: (!) 177/81 (!) 159/94  Pulse: 83   Resp: 19   Temp: 36.7 C     Last Pain:  Vitals:   11/23/16 0833  TempSrc: Oral                 Tarvaris Puglia DAVID

## 2016-11-23 NOTE — Discharge Instructions (Signed)
Endoscopy °Care After °Please read the instructions outlined below and refer to this sheet in the next few weeks. These discharge instructions provide you with general information on caring for yourself after you leave the hospital. Your doctor may also give you specific instructions. While your treatment has been planned according to the most current medical practices available, unavoidable complications occasionally occur. If you have any problems or questions after discharge, please call Dr. Outlaw ([facility]) at [phone]. ° °HOME CARE INSTRUCTIONS °Activity °· You may resume your regular activity but move at a slower pace for the next 24 hours.  °· Take frequent rest periods for the next 24 hours.  °· Walking will help expel (get rid of) the air and reduce the bloated feeling in your abdomen.  °· No driving for 24 hours (because of the anesthesia (medicine) used during the test).  °· You may shower.  °· Do not sign any important legal documents or operate any machinery for 24 hours (because of the anesthesia used during the test).  °Nutrition °· Drink plenty of fluids.  °· You may resume your normal diet.  °· Begin with a light meal and progress to your normal diet.  °· Avoid alcoholic beverages for 24 hours or as instructed by your caregiver.  °Medications °You may resume your normal medications unless your caregiver tells you otherwise. °What you can expect today °· You may experience abdominal discomfort such as a feeling of fullness or "gas" pains.  °· You may experience a sore throat for 2 to 3 days. This is normal. Gargling with salt water may help this.  °·  °SEEK IMMEDIATE MEDICAL CARE IF: °· You have excessive nausea (feeling sick to your stomach) and/or vomiting.  °· You have severe abdominal pain and distention (swelling).  °· You have trouble swallowing.  °· You have a temperature over 100° F (37.8° C).  °· You have rectal bleeding or vomiting of blood.  °Document Released:  07/05/2004 Document Revised: 08/03/2011 Document Reviewed: 01/16/2008 °ExitCare® Patient Information ©2012 [employer]. °

## 2016-11-23 NOTE — Op Note (Signed)
[facility] Patient Name: Charlotte Roth Procedure Date: 11/23/2016 MRN: [id number] Attending MD: Arta Silence , MD Date of Birth: Sep 30, 1961 CSN: [id number] Age: 55 Admit Type: Outpatient Procedure:                Upper GI endoscopy Indications:              Chronic cough Providers:                Arta Silence, MD, Carolynn Comment, RN, Elspeth Cho Tech., Technician, Derrek Gu. Alday CRNA,                            CRNA Referring MD:             Jani Gravel, MD Medicines:                Monitored Anesthesia Care Complications:            No immediate complications. Estimated Blood Loss:     Estimated blood loss: none. Procedure:                Pre-Anesthesia Assessment:                           - Prior to the procedure, a History and Physical                            was performed, and patient medications and                            allergies were reviewed. The patient's tolerance of                            previous anesthesia was also reviewed. The risks                            and benefits of the procedure and the sedation                            options and risks were discussed with the patient.                            All questions were answered, and informed consent                            was obtained. Prior Anticoagulants: The patient has                            taken no previous anticoagulant or antiplatelet                            agents. ASA Grade Assessment: II - A patient with  mild systemic disease. After reviewing the risks                            and benefits, the patient was deemed in                            satisfactory condition to undergo the procedure.                           After obtaining informed consent, the endoscope was                            passed under direct vision. Throughout the                            procedure, the patient's blood  pressure, pulse, and                            oxygen saturations were monitored continuously. The                            [id number]) scope was introduced through the                            mouth, and advanced to the second part of duodenum.                            The upper GI endoscopy was accomplished without                            difficulty. The patient tolerated the procedure                            well. Scope In: Scope Out: Findings:      The examined esophagus was normal. The BRAVO capsule with delivery       system was introduced through the mouth and advanced into the esophagus,       such that the BRAVO pH capsule was positioned 32 cm from the incisors,       which was 6 cm proximal to the GE junction. The BRAVO pH capsule was       then deployed and attached to the esophageal mucosa. The delivery system       was then withdrawn. Endoscopy was utilized for probe placement and       diagnostic evaluation. The scope was reinserted to evaluate placement of       the BRAVO capsule. Visualization showed the BRAVO capsule to be in an       appropriate position.      A few small sessile polyps were found in the gastric fundus and in the       gastric body. This was biopsied with a cold forceps for histology.      The exam of the stomach was otherwise normal.      The duodenal bulb, first portion of the duodenum and second portion of       the duodenum were normal.  Impression:               - Normal esophagus.                           - A few gastric polyps. Biopsied.                           - Normal duodenal bulb, first portion of the                            duodenum and second portion of the duodenum.                           - The BRAVO pH capsule was deployed. Moderate Sedation:      N/A- Per Anesthesia Care Recommendation:           - Patient has a contact number available for                            emergencies. The signs and symptoms of  potential                            delayed complications were discussed with the                            patient. Return to normal activities tomorrow.                            Written discharge instructions were provided to the                            patient.                           - Discharge patient to home (via wheelchair).                           - Resume previous diet today.                           - Continue present medications.                           - Await pathology results.                           - Return to GI clinic after studies are complete.                           - Return to referring physician as previously                            scheduled. Procedure Code(s):        --- Professional ---  [id number], Esophagogastroduodenoscopy, flexible,                            transoral; with biopsy, single or multiple Diagnosis Code(s):        --- Professional ---                           K31.7, Polyp of stomach and duodenum                           R05, Cough CPT copyright 2016 [employer]. All rights reserved. The codes documented in this report are preliminary and upon coder review may  be revised to meet current compliance requirements. Arta Silence, MD 11/23/2016 9:31:29 AM This report has been signed electronically. Number of Addenda: 0

## 2016-11-24 ENCOUNTER — Encounter (HOSPITAL_COMMUNITY): Payer: Self-pay | Admitting: Gastroenterology

## 2016-12-22 ENCOUNTER — Ambulatory Visit (INDEPENDENT_AMBULATORY_CARE_PROVIDER_SITE_OTHER): Payer: BLUE CROSS/BLUE SHIELD | Admitting: Otolaryngology

## 2017-01-02 ENCOUNTER — Ambulatory Visit (INDEPENDENT_AMBULATORY_CARE_PROVIDER_SITE_OTHER): Payer: BLUE CROSS/BLUE SHIELD | Admitting: Internal Medicine

## 2017-01-02 ENCOUNTER — Telehealth: Payer: Self-pay | Admitting: Internal Medicine

## 2017-01-02 ENCOUNTER — Encounter: Payer: Self-pay | Admitting: Internal Medicine

## 2017-01-02 VITALS — BP 138/82 | HR 81 | Ht 64.0 in | Wt 204.0 lb

## 2017-01-02 DIAGNOSIS — Z23 Encounter for immunization: Secondary | ICD-10-CM

## 2017-01-02 DIAGNOSIS — R0982 Postnasal drip: Secondary | ICD-10-CM | POA: Diagnosis not present

## 2017-01-02 DIAGNOSIS — J387 Other diseases of larynx: Secondary | ICD-10-CM | POA: Diagnosis not present

## 2017-01-02 DIAGNOSIS — R05 Cough: Secondary | ICD-10-CM | POA: Diagnosis not present

## 2017-01-02 DIAGNOSIS — R053 Chronic cough: Secondary | ICD-10-CM

## 2017-01-02 NOTE — Telephone Encounter (Signed)
Jasmin  Pls call Jani Gravel, MD office and find out what cough medicine syrup wa given that helped her  Thanks  Dr. Brand Males, M.D., [facility].C.P Pulmonary and Critical Care Medicine Staff Physician [place] [facility] Pager: [phone], If no answer or between  15:00h - 7:00h: call [phone]  01/02/2017 10:03 AM

## 2017-01-02 NOTE — Progress Notes (Signed)
Subjective:     Patient ID: Charlotte Roth, female   DOB: 04-17-1961, 56 y.o.   MRN: [id number]  HPI  Followup Multifactorial cough  OV 06/28/11: Last visit 05/12/11. Multifactorial interventions done - LPR cough with sinus, gerd issues. Following interventions, cough 60% better in terms of severity and frequency. Cough still dry. GAggin and throwing up at night present but reduced. Postnasal drip reduced but still present. Still socially embarrassing. 2 tabs chlorphen making her dry; > 90% compliant. Takes sudafed. Finds QNASL wonderful and best help. However, having difficulty with GERD precatuions especially the diet sheet and zegerid compliance. Notes that coming off zegerid x 3 days, made cough worse. Did have methacholine challenget test - negative on 05/18/11 for asthma.   No new issues  Your chronic cough is probably combination of sinus drainage, possible Acid reflux, possible cyclical cough  # for sinus  - continue chlorpheniramine 4mg  x 2 tablets each night - if this makes you too sleepy or dry back off to 1 tablet daily  - take sudafed 12h XR tablet x 1 daily  - continue QNASL 2 squirts each nostril daily - take 2 samples and prescription - iff too expensive call us  # for acid reflux  - take diet sheet again and follow all we discussed  - take over the counter zegerid 20mg  capsule once each morning on empty stomach  - do not go back to fish oil  # for possible cyclical cough  - suck on lozenges or sugarless mint or drink water each time you have an urge to cough  - talke 3 days off for complete voice rest - no whispering or talking - in next 2-3 weeks  #FOLLOWUP  - important you follow each advice 100%  - return in 6 weeks  - depending on course CT scan    OV 08/17/11: Followup for above. States that cough imprved till week ago but past week cough is worse. States significantly worse - worse than prior ov in July but not as bad as first time she saw me (2 visits ago). Currently  rates cough as moderate. Cough currently productive - whitish and orange (before it was clear). No associated wheezing but gags + when cough present. RSI cough score is 12 of 35 which is below the cut off for LPR cough but then she is on partial Rx. Significant associated post nasal drainage persists; this is worse. Post nsal drip worse despite taking 2 tabs chlorphen at night, morning sudafed and daily QNASL for which she is compliant. In terms of GERD control: compliant with zegerid which she is compliant. But is struggling with diet control; made some improvements like changing to decaf coffee but feels she is not strict about diet at all. Of note, did not do 3 days of voice rest (she says she talks a lot and cannot get 3 days to get voice rest though she joked that her husband would love it. 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       IOV 03/08/2016  Chief Complaint  Patient presents with  . Pulmonary Consult    Pt referred by Dr. Jani Gravel for chronic cough. Pt states her cough has worsened in the past week d/t recent cold. Pt states the cough is productive with pink mucus in morning and white mucus throughout the day. Pt c/o DOE, sinus congestion.     56 year old female last seen over 3 years ago there for this and  you will office consult. For by Dr. Jani Gravel. History is gained from talking to the patient and review of the chart. She reports running cough all her life. Insidious onset. No clearcut starting point. It has been there for several years. Maybe even several decades. Overall stable. Last seen by myself in 2012. Cough overall has not changed since then except for the past 1-2 weeks she's having some acute bronchitis worsening and current RSI cough score below is a reflection of the current worsening. But at baseline she has cough on a daily basis. The cough does not bother her at night except when she is sick with acute bronchitis. The cough is made worse by cold weather warm  temperature and occasionally when eating. The cough is improved by drinking cold water. She has water with her all the time. Talking a lot makes it worse. It is associated with a tickle in the throat. She clears her throat and is constant postnasal drip.  Prior trials of acid reflux therapy and allergy testing with Dr. Hardie Pulley and PPI therapy with Dr. Paulita Fujita in the last few years according to history of not helped. She had a maxillofacial CT May 2015 which I personally visualized and is reported as normal.last chest x-ray was in 2012 that I personally visualized and it looks clear to me.  Current therapies include nasal steroid, antihistamine, Singulair and Qvar which she is not sure is helping or not. 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 Follow-up    Pt states that her cough has improved with gabapentin. She can only take 2 per day due to side effects. Pt reports occasional cough. Pt denies wheeze/SOB/CP/tightness. Pt would like to try alternative to Qnasl due to side effects.     Follow-up chronic cough. She tells me that she is significantly better after started her on gabapentin. She is unable to tolerate intervention twice daily. At last visit she reports a cough intensity is 8 out of 10 but at this visit is only 3 out of 10. Both husband and coworkers fine significant improvement. She's had a few significant episodes of cough. Talking at work makes her cough significantly work worse again and we did her cough scoring it appears that it is irritable larynx or cough neuropathy that is a significant flare in her cough. She again says that she coughs most of the day. Temperature changes make her cough. Laughing makes her cough. Smells make her cough. Speaking in singing make her cough. But overall she is better.  As part  of her cough workup we did a high-resolution CT chest. This was done in mid April 2017. There are findings of interstitial lung disease nSip pattern. I gave her these results over the phone. She got extremely worried because her father died from pulmonary fibrosis. She told me at that time she did have flu like illness and she wonders if he findings of because of those. Nevertheless she wanted to have an autoimmune workup which we did 04/07/2016 along with hypersensitive pneumonitis panel. This is negative except for ANA which is trace positive at 1:40.   OV  07/25/2016  Chief Complaint  Patient presents with  . Follow-up    Pt here after PFT. Pt states the gabapentin has helped but her  cough has not yet fully resolved. Pt states she has her coughing spells after she eats.     Follow-up chronic cough. Since her last visit she is on gabapentin 2 times daily. Cough is improved. RSI cough score is 17 and shows a 50% improvement compared to April 2017. She had a follow-up CT chest high resolution July 2017 and this was normal. I personally visualized. She had a PFT today and it is normal pressure visualized the results. She is asking about continuing versus cutting down her gabapentin. Off note she did not attend neuro rehabilitation for her chronic cough. Currently even though she is better she has significant postnasal drainage according to her history. She also has cough after eating. She is rated both these symptoms at 5 out of 5. She's not been evaluated for these. She prefers to get evaluated.   OV 01/02/2017  Chief Complaint  Patient presents with  . Follow-up    Pt states her cough is unchanged since last OV. Her cough is doing well at night but is more prominent at night - this is unchanged sicne last OV in 07/2016. Pt states she last seen Dr. Orvil Feil about a year ago.     Follow-up idiopathic chronic multifactorial cough. Last seen August 2017. In the interim she says she did see ENT and GI  and workup has been noncontributory but her cough still persists at the same intensity without any change. Cough is mostly in the daytime. It hardly wakes her up at night. Cough is made worse by eating and also talking. She did try Qvar but the powdered inhaler made her cough is worse particularly in the throat area. She is frustrated. Cough is productive of some white sputum occasionally. She feels all her problems are from the sinus. She says that the physician's assistant at Dr. Maudie Mercury her primary care physician's office give her some concern for cough medication and it helped but she does not know the name.     Dr Lorenza Cambridge Reflux Symptom Index (> 13-15 suggestive of LPR cough) 03/08/2016  07/25/2016   Hoarseness of problem with voice 4 2  Clearing  Of Throat 4 2  Excess throat mucus or feeling of post nasal drip 5 5  Difficulty swallowing food, liquid or tablets 1 0  Cough after eating or lying down 4 5  Breathing difficulties or choking episodes 3 0  Troublesome or annoying cough 5 3  Sensation of something sticking in throat or lump in throat 1 0  Heartburn, chest pain, indigestion, or stomach acid coming up 1 0  TOTAL 28 17          has a past medical history of ALLERGIC RHINITIS; Asthma; Bronchitis (10/2016); Chronic cough; GERD (gastroesophageal reflux disease); and PONV (postoperative nausea and vomiting).   reports that she has never smoked. She has never used smokeless tobacco.  Past Surgical History:  Procedure Laterality Date  . BRAVO [place] STUDY N/A 11/23/2016   Procedure: BRAVO [place];  Surgeon: Arta Silence, MD;  Location: [facility];  Service: Endoscopy;  Laterality: N/A;  . ESOPHAGOGASTRODUODENOSCOPY (EGD) WITH PROPOFOL N/A 11/23/2016   Procedure: ESOPHAGOGASTRODUODENOSCOPY (EGD) WITH PROPOFOL;  Surgeon: Arta Silence, MD;  Location: [facility];  Service: Endoscopy;  Laterality: N/A;  . OOPHORECTOMY  age 4   left ovary"open bikini cut"  . TONSILLECTOMY       Allergies  Allergen Reactions  . Codeine Nausea And Vomiting    Immunization History  Administered Date(s) Administered  .  Influenza Split 09/26/2011    Family History  Problem Relation Age of Onset  . Asthma Mother   . Pulmonary fibrosis Father      Current Outpatient Prescriptions:  .  Beclomethasone Dipropionate (QNASL) 80 MCG/ACT AERS, Place 1 spray into the nose 2 (two) times daily. , Disp: , Rfl:  .  gabapentin (NEURONTIN) 300 MG capsule, 300mg  once daily x 5 days, then 300mg  twice daily x 5 days, then 300mg  three times daily to continue (Patient taking differently: Take 300 mg by mouth at bedtime. ), Disp: 90 capsule, Rfl: 5 .  levocetirizine (XYZAL) 5 MG tablet, Take 5 mg by mouth daily. , Disp: , Rfl:  .  montelukast (SINGULAIR) 10 MG tablet, Take 10 mg by mouth at bedtime., Disp: , Rfl:  .  omeprazole (PRILOSEC OTC) 20 MG tablet, Take 20 mg by mouth daily., Disp: , Rfl:  .  PROAIR HFA 108 (90 Base) MCG/ACT inhaler, Inhale 2 puffs into the lungs as needed., Disp: , Rfl: 1 .  ranitidine (ZANTAC 150 MAXIMUM STRENGTH) 150 MG tablet, Take 150 mg by mouth every evening., Disp: , Rfl:     Review of Systems     Objective:   Physical Exam  Constitutional: She is oriented to person, place, and time. She appears well-developed and well-nourished. No distress.  HENT:  Head: Normocephalic and atraumatic.  Right Ear: External ear normal.  Left Ear: External ear normal.  Mouth/Throat: Oropharynx is clear and moist. No oropharyngeal exudate.  Eyes: Conjunctivae and EOM are normal. Pupils are equal, round, and reactive to light. Right eye exhibits no discharge. Left eye exhibits no discharge. No scleral icterus.  Neck: Normal range of motion. Neck supple. No JVD present. No tracheal deviation present. No thyromegaly present.  Cardiovascular: Normal rate, regular rhythm, normal heart sounds and intact distal pulses.  Exam reveals no gallop and no friction rub.   No murmur  heard. Pulmonary/Chest: Effort normal and breath sounds normal. No respiratory distress. She has no wheezes. She has no rales. She exhibits no tenderness.  Abdominal: Soft. Bowel sounds are normal. She exhibits no distension and no mass. There is no tenderness. There is no rebound and no guarding.  Musculoskeletal: Normal range of motion. She exhibits no edema or tenderness.  Lymphadenopathy:    She has no cervical adenopathy.  Neurological: She is alert and oriented to person, place, and time. She has normal reflexes. No cranial nerve deficit. She exhibits normal muscle tone. Coordination normal.  Skin: Skin is warm and dry. No rash noted. She is not diaphoretic. No erythema. No pallor.  Psychiatric: She has a normal mood and affect. Her behavior is normal. Judgment and thought content normal.  Vitals reviewed.  Vitals:   01/02/17 0921  BP: 138/82  Pulse: 81  SpO2: 99%  Weight: 204 lb (92.5 kg)  Height: 5\' 4"  (1.626 m)   Estimated body mass index is 35.02 kg/m as calculated from the following:   Height as of this encounter: 5\' 4"  (1.626 m).   Weight as of this encounter: 204 lb (92.5 kg).      Assessment:       ICD-9-CM ICD-10-CM   1. Chronic cough 786.2 R05   2. Irritable larynx 478.79 J38.7   3. 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 Brand Males, M.D., [facility].C.P Pulmonary and Critical Care Medicine Staff Physician [place] [facility] Pager: [phone], If no answer or between  15:00h - 7:00h: call [phone]  01/02/2017 9:59 AM

## 2017-01-02 NOTE — Patient Instructions (Addendum)
ICD-9-CM ICD-10-CM   1. Chronic cough 786.2 R05   2. Irritable larynx 478.79 J38.7   3. 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

## 2017-01-02 NOTE — Telephone Encounter (Signed)
Had to leave a message for Dr. Jeneen Rinks nurse as she was away from her desk

## 2017-01-03 NOTE — Telephone Encounter (Signed)
lmtcb x1 for pt. 

## 2017-01-03 NOTE — Telephone Encounter (Signed)
MR  Pt. Received promethazine 6.25/74ml cough syrup on 12/09/16

## 2017-01-03 NOTE — Telephone Encounter (Signed)
Ok to do that 5ML once a day for 30 days without refill  Thanks  Dr. Brand Males, M.D., [facility].C.P Pulmonary and Critical Care Medicine Staff Physician [place] [facility] Pager: [phone], If no answer or between  15:00h - 7:00h: call [phone]  01/03/2017 1:54 PM

## 2017-01-04 MED ORDER — PROMETHAZINE-CODEINE 6.25-10 MG/5ML PO SYRP: 5.0000 mL | ORAL_SOLUTION | Freq: Every day | ORAL | 0 refills | Status: DC | PRN

## 2017-01-04 NOTE — Telephone Encounter (Signed)
Patient returned call, please call her at work [phone].

## 2017-01-04 NOTE — Telephone Encounter (Signed)
Spoke with the pt  She is aware of rx  I have called this in and nothing further needed

## 2017-01-16 DIAGNOSIS — Z8349 Family history of other endocrine, nutritional and metabolic diseases: Secondary | ICD-10-CM | POA: Diagnosis not present

## 2017-01-16 DIAGNOSIS — R05 Cough: Secondary | ICD-10-CM | POA: Diagnosis not present

## 2017-01-16 DIAGNOSIS — J383 Other diseases of vocal cords: Secondary | ICD-10-CM | POA: Diagnosis not present

## 2017-02-08 DIAGNOSIS — J385 Laryngeal spasm: Secondary | ICD-10-CM | POA: Diagnosis not present

## 2017-02-08 DIAGNOSIS — R05 Cough: Secondary | ICD-10-CM | POA: Diagnosis not present

## 2017-02-16 ENCOUNTER — Ambulatory Visit (INDEPENDENT_AMBULATORY_CARE_PROVIDER_SITE_OTHER)
Admission: RE | Admit: 2017-02-16 | Discharge: 2017-02-16 | Disposition: A | Discharge status: Home / Self Care | Payer: BLUE CROSS/BLUE SHIELD | Source: Ambulatory Visit | Attending: Adult Health | Admitting: Adult Health

## 2017-02-16 ENCOUNTER — Ambulatory Visit (INDEPENDENT_AMBULATORY_CARE_PROVIDER_SITE_OTHER): Payer: BLUE CROSS/BLUE SHIELD | Admitting: Adult Health

## 2017-02-16 ENCOUNTER — Other Ambulatory Visit (INDEPENDENT_AMBULATORY_CARE_PROVIDER_SITE_OTHER): Payer: BLUE CROSS/BLUE SHIELD

## 2017-02-16 ENCOUNTER — Encounter: Payer: Self-pay | Admitting: Adult Health

## 2017-02-16 VITALS — BP 138/86 | HR 84 | Temp 98.4°F | Ht 63.0 in | Wt 203.4 lb

## 2017-02-16 DIAGNOSIS — R042 Hemoptysis: Secondary | ICD-10-CM | POA: Diagnosis not present

## 2017-02-16 DIAGNOSIS — R05 Cough: Secondary | ICD-10-CM | POA: Diagnosis not present

## 2017-02-16 DIAGNOSIS — J208 Acute bronchitis due to other specified organisms: Secondary | ICD-10-CM

## 2017-02-16 MED ORDER — AZITHROMYCIN 250 MG PO TABS: ORAL_TABLET | ORAL | 0 refills | Status: AC

## 2017-02-16 NOTE — Patient Instructions (Addendum)
Zpack take as directed.  Mucinex DM Twice daily  As needed  Cough/congestion .  Labs today .  Follow up Dr. Chase Caller in May as planned and As needed   Please contact office for sooner follow up if symptoms do not improve or worsen or seek emergency care

## 2017-02-16 NOTE — Progress Notes (Signed)
@Patient  ID: Charlotte Roth, female    DOB: 05/10/1961, 56 y.o.   MRN: [id number]  Chief Complaint  Patient presents with  . Acute Visit    Cough     Referring provider: Jani Gravel, MD  HPI: 57 yo female followed for chronic cough   TEST  2017 Autoimmune /HSP neg ,except for trace positive ANA  HRCT 06/2016 neg for ILD . Mild air trapping  07/2016 Nml PFT    02/17/2017 Acute OV  Pt presents for an acute office visit. Complains of 1 day of blood tinged mucus. This morning coughed up blood tinged mucus , has seen pink tinged since then .  Has some low grade fever.  Noticed that her cough was worse yesterday.  Has a lot of congestion and mucus. No increased sinus drainage .  No chest pain , dyspnea , calf pain. No hx of VTE . No HRT.  Family member had Flu and PNA recently .   Pt has known chronic cough , workup has been unrevealing with essentially neg autoimmune panel except for trace positive ANA. HRCT chest was neg for ILD changes. PFT showed nml lung function.    She did have car trip to [place] 3 weeks ago , no calf pain.  CXR today is clear .  Allergies  Allergen Reactions  . Codeine Nausea And Vomiting    Immunization History  Administered Date(s) Administered  . Influenza Split 09/26/2011  . Influenza,inj,Quad PF,36+ Mos 01/02/2017    Past Medical History:  Diagnosis Date  . ALLERGIC RHINITIS   . Asthma    pt unsure because some doctors say she has asthma, others say no  . Bronchitis 10/2016   about a month ago  . Chronic cough    coughing and wheezing episode-uses Inhalers as needed  . GERD (gastroesophageal reflux disease)   . PONV (postoperative nausea and vomiting)     Tobacco History: History  Smoking Status  . Never Smoker  Smokeless Tobacco  . Never Used   Counseling given: Not Answered   Outpatient Encounter Prescriptions as of 02/16/2017  Medication Sig  . Beclomethasone Dipropionate (QNASL) 80 MCG/ACT AERS Place 1 spray into the nose  2 (two) times daily.   Marland Kitchen gabapentin (NEURONTIN) 300 MG capsule 300mg  once daily x 5 days, then 300mg  twice daily x 5 days, then 300mg  three times daily to continue (Patient taking differently: Take 300 mg by mouth at bedtime. )  . levocetirizine (XYZAL) 5 MG tablet Take 5 mg by mouth daily.   . montelukast (SINGULAIR) 10 MG tablet Take 10 mg by mouth at bedtime.  Marland Kitchen omeprazole (PRILOSEC OTC) 20 MG tablet Take 20 mg by mouth daily.  Marland Kitchen PROAIR HFA 108 (90 Base) MCG/ACT inhaler Inhale 2 puffs into the lungs as needed.  . promethazine-codeine (PHENERGAN WITH CODEINE) 6.25-10 MG/5ML syrup Take 5 mLs by mouth daily as needed for cough.  . ranitidine (ZANTAC 150 MAXIMUM STRENGTH) 150 MG tablet Take 150 mg by mouth every evening.  Marland Kitchen azithromycin (ZITHROMAX Z-PAK) 250 MG tablet Take 2 tablets (500 mg) on  Day 1,  followed by 1 tablet (250 mg) once daily on Days 2 through 5.   No facility-administered encounter medications on file as of 02/16/2017.      Review of Systems  Constitutional:   No  weight loss, night sweats,  Fevers, chills, fatigue, or  lassitude.  HEENT:   No headaches,  Difficulty swallowing,  Tooth/dental problems, or  Sore throat,  No sneezing, itching, ear ache, nasal congestion, post nasal drip,   CV:  No chest pain,  Orthopnea, PND, swelling in lower extremities, anasarca, dizziness, palpitations, syncope.   GI  No heartburn, indigestion, abdominal pain, nausea, vomiting, diarrhea, change in bowel habits, loss of appetite, bloody stools.   Resp:  .  No chest wall deformity  Skin: no rash or lesions.  GU: no dysuria, change in color of urine, no urgency or frequency.  No flank pain, no hematuria   MS:  No joint pain or swelling.  No decreased range of motion.  No back pain.    Physical Exam  BP 138/86 (BP Location: Left Arm, Cuff Size: Normal)   Pulse 84   Temp 98.4 F (36.9 C) (Oral)   Ht 5\' 3"  (1.6 m)   Wt 203 lb 6.4 oz (92.3 kg)   LMP 10/25/2011    SpO2 98%   BMI 36.03 kg/m   GEN: A/Ox3; pleasant , NAD, obese    HEENT:  [place]/AT,  EACs-clear, TMs-wnl, NOSE-clear, THROAT-clear, no lesions, no postnasal drip or exudate noted.   NECK:  Supple w/ fair ROM; no JVD; normal carotid impulses w/o bruits; no thyromegaly or nodules palpated; no lymphadenopathy.    RESP  Clear  P & A; w/o, wheezes/ rales/ or rhonchi. no accessory muscle use, no dullness to percussion  CARD:  RRR, no m/r/g, no peripheral edema, pulses intact, no cyanosis or clubbing.  GI:   Soft & nt; nml bowel sounds; no organomegaly or masses detected.   Musco: Warm bil, no deformities or joint swelling noted.   Neuro: alert, no focal deficits noted.    Skin: Warm, no lesions or rashes   Lab Results:  CBC No results found for: WBC, RBC, HGB, HCT, PLT, MCV, MCH, MCHC, RDW, LYMPHSABS, MONOABS, EOSABS, BASOSABS  BMET    Component Value Date/Time   NA 136 02/16/2017 1704   K 4.2 02/16/2017 1704   CL 101 02/16/2017 1704   CO2 30 02/16/2017 1704   GLUCOSE 89 02/16/2017 1704   BUN 15 02/16/2017 1704   CREATININE 0.72 02/16/2017 1704   CALCIUM 9.6 02/16/2017 1704    BNP No results found for: BNP  ProBNP No results found for: PROBNP  Imaging: Dg Chest 2 View  Result Date: 02/16/2017 CLINICAL DATA:  Cough for 2 days. Hemoptysis today. History of asthma. EXAM: CHEST  2 VIEW COMPARISON:  Chest CT, 06/28/2016.  Chest radiographs, 05/13/2013. FINDINGS: The cardiac silhouette is normal in size. No mediastinal or hilar masses. No evidence of adenopathy. Clear lungs.  No pleural effusion.  No pneumothorax. Skeletal structures are intact. IMPRESSION: No active cardiopulmonary disease. Electronically Signed   By: Lajean Manes M.D.   On: 02/16/2017 16:21     Assessment & Plan:   Acute bronchitis AB , CXR is clear   Plan  . Patient Instructions  Zpack take as directed.  Mucinex DM Twice daily  As needed  Cough/congestion .  Labs today .  Follow up Dr. Chase Caller  in May as planned and As needed   Please contact office for sooner follow up if symptoms do not improve or worsen or seek emergency care      Hemoptysis ?etiology , ? Chronic cough /bronchitis  Recent long travel, check [place]  CXR is clear .   Plan  Patient Instructions  Zpack take as directed.  Mucinex DM Twice daily  As needed  Cough/congestion .  Labs today .  Follow up Dr.  Ramaswamy in May as planned and As needed   Please contact office for sooner follow up if symptoms do not improve or worsen or seek emergency care         Rexene Edison, NP 02/17/2017

## 2017-02-17 DIAGNOSIS — R042 Hemoptysis: Secondary | ICD-10-CM | POA: Insufficient documentation

## 2017-02-17 DIAGNOSIS — J209 Acute bronchitis, unspecified: Secondary | ICD-10-CM | POA: Insufficient documentation

## 2017-02-17 LAB — BASIC METABOLIC PANEL
BUN: 15 mg/dL (ref 6–23)
CALCIUM: 9.6 mg/dL (ref 8.4–10.5)
CO2: 30 meq/L (ref 19–32)
Chloride: 101 mEq/L (ref 96–112)
Creatinine, Ser: 0.72 mg/dL (ref 0.40–1.20)
GFR: 89.56 mL/min (ref >60.00)
Glucose, Bld: 89 mg/dL (ref 70–99)
Potassium: 4.2 mEq/L (ref 3.5–5.1)
SODIUM: 136 meq/L (ref 135–145)

## 2017-02-17 LAB — D-DIMER, QUANTITATIVE: D-Dimer, Quant: 0.28 mcg/mL FEU (ref <0.50)

## 2017-02-17 NOTE — Assessment & Plan Note (Signed)
?  etiology , ? Chronic cough /bronchitis  Recent long travel, check [place]  CXR is clear .   Plan  Patient Instructions  Zpack take as directed.  Mucinex DM Twice daily  As needed  Cough/congestion .  Labs today .  Follow up Dr. Chase Caller in May as planned and As needed   Please contact office for sooner follow up if symptoms do not improve or worsen or seek emergency care

## 2017-02-17 NOTE — Assessment & Plan Note (Signed)
AB , CXR is clear   Plan  . Patient Instructions  Zpack take as directed.  Mucinex DM Twice daily  As needed  Cough/congestion .  Labs today .  Follow up Dr. Chase Caller in May as planned and As needed   Please contact office for sooner follow up if symptoms do not improve or worsen or seek emergency care

## 2017-02-21 NOTE — Progress Notes (Signed)
LMOMTCB x 1 

## 2017-02-22 NOTE — Progress Notes (Signed)
Patient returned call.  Advised of lab results / recs as stated by TP.  Pt verbalized understanding and denied any questions. 

## 2017-03-27 DIAGNOSIS — Z01419 Encounter for gynecological examination (general) (routine) without abnormal findings: Secondary | ICD-10-CM | POA: Diagnosis not present

## 2017-03-27 DIAGNOSIS — Z1231 Encounter for screening mammogram for malignant neoplasm of breast: Secondary | ICD-10-CM | POA: Diagnosis not present

## 2017-03-27 DIAGNOSIS — Z6835 Body mass index (BMI) 35.0-35.9, adult: Secondary | ICD-10-CM | POA: Diagnosis not present

## 2017-04-17 ENCOUNTER — Other Ambulatory Visit: Payer: Self-pay | Admitting: Internal Medicine

## 2017-07-18 ENCOUNTER — Ambulatory Visit: Payer: Self-pay | Admitting: Allergy & Immunology

## 2017-07-18 ENCOUNTER — Other Ambulatory Visit: Payer: Self-pay | Admitting: Internal Medicine

## 2017-08-15 ENCOUNTER — Encounter (INDEPENDENT_AMBULATORY_CARE_PROVIDER_SITE_OTHER): Payer: Self-pay

## 2017-08-15 ENCOUNTER — Encounter: Payer: Self-pay | Admitting: Allergy & Immunology

## 2017-08-15 ENCOUNTER — Ambulatory Visit (INDEPENDENT_AMBULATORY_CARE_PROVIDER_SITE_OTHER): Payer: BLUE CROSS/BLUE SHIELD | Admitting: Allergy & Immunology

## 2017-08-15 VITALS — BP 140/100 | HR 89 | Temp 97.8°F | Resp 17 | Ht 64.57 in | Wt 206.8 lb

## 2017-08-15 DIAGNOSIS — T781XXD Other adverse food reactions, not elsewhere classified, subsequent encounter: Secondary | ICD-10-CM

## 2017-08-15 DIAGNOSIS — J302 Other seasonal allergic rhinitis: Secondary | ICD-10-CM

## 2017-08-15 DIAGNOSIS — J3089 Other allergic rhinitis: Secondary | ICD-10-CM | POA: Diagnosis not present

## 2017-08-15 DIAGNOSIS — R05 Cough: Secondary | ICD-10-CM

## 2017-08-15 DIAGNOSIS — R053 Chronic cough: Secondary | ICD-10-CM

## 2017-08-15 MED ORDER — IPRATROPIUM BROMIDE 0.03 % NA SOLN: 2.0000 | Freq: Two times a day (BID) | NASAL | 5 refills | Status: DC

## 2017-08-15 MED ORDER — CARBINOXAMINE MALEATE 6 MG PO TABS: 1.0000 | ORAL_TABLET | Freq: Two times a day (BID) | ORAL | 5 refills | Status: DC | PRN

## 2017-08-15 NOTE — Patient Instructions (Addendum)
1. Chronic rhinitis - Testing today showed: ragweed, grasses, molds, dust mites and cat - Avoidance measures provided. - Continue with Qnasl one puff per nostril daily and Xyzal (levocetirizine) 5mg  tablet once daily (you can stop the Xyzal if you like the Ryvent instead) - Start ipratropium nasal spray one spray per nostril every 6 hours as needed and Ryvent (carbinoxamine) 6mg  tablet 3-4 times daily as needed - You can use an extra dose of the antihistamine, if needed, for breakthrough symptoms.  - Consider nasal saline rinses 1-2 times daily to remove allergens from the nasal cavities as well as help with mucous clearance (this is especially helpful to do before the nasal sprays are given) - Consider allergy shots as a means of long-term control. - Allergy shots "re-train" and "reset" the immune system to ignore environmental allergens and decrease the resulting immune response to those allergens (sneezing, itchy watery eyes, runny nose, nasal congestion, etc).    - Allergy shots improve symptoms in 75-85% of patients.  - We can discuss more at the next appointment if the medications are not working for you.   2. Chronic cough - Testing to the most common food allergens was negative (peanut, tree nut, soy, fish mix, shellfish mix, wheat, milk, egg) - Foods are not likely to be contributing to your coughing.,  - This cough is likely from a multitude of factors. - Continue with your reflux medications. - Hopefully controlling the postnasal drip will help with coughing.  3. Return in about 3 months (around 11/14/2017).     Please inform us of any Emergency Department visits, hospitalizations, or changes in symptoms. Call us before going to the ED for breathing or allergy symptoms since we might be able to fit you in for a sick visit. Feel free to contact us anytime with any questions, problems, or concerns.  It was a pleasure to meet you today! Enjoy the upcoming fall season! Good luck with  hurricane preparations!  Websites that have reliable patient information: 1. American Academy of Asthma, Allergy, and Immunology: www.aaaai.org 2. Food Allergy Research and Education (FARE): foodallergy.org 3. Mothers of Asthmatics: http://www.asthmacommunitynetwork.org 4. American College of Allergy, Asthma, and Immunology: www.acaai.org   Election Day is coming up on Tuesday, November 6th! Make your voice heard! Register to vote at [email or web address]!     Reducing Pollen Exposure  The American Academy of Allergy, Asthma and Immunology suggests the following steps to reduce your exposure to pollen during allergy seasons.    1. Do not hang sheets or clothing out to dry; pollen may collect on these items. 2. Do not mow lawns or spend time around freshly cut grass; mowing stirs up pollen. 3. Keep windows closed at night.  Keep car windows closed while driving. 4. Minimize morning activities outdoors, a time when pollen counts are usually at their highest. 5. Stay indoors as much as possible when pollen counts or humidity is high and on windy days when pollen tends to remain in the air longer. 6. Use air conditioning when possible.  Many air conditioners have filters that trap the pollen spores. 7. Use a HEPA room air filter to remove pollen form the indoor air you breathe.  Control of Mold Allergen  Mold and fungi can grow on a variety of surfaces provided certain temperature and moisture conditions exist.  Outdoor molds grow on plants, decaying vegetation and soil.  The major outdoor mold, Alternaria and Cladosporium, are found in very high numbers during hot and dry conditions.  Generally, a late Summer - Fall peak is seen for common outdoor fungal spores.  Rain will temporarily lower outdoor mold spore count, but counts rise rapidly when the rainy period ends.  The most important indoor molds are Aspergillus and Penicillium.  Dark, humid and poorly ventilated basements are ideal sites for mold  growth.  The next most common sites of mold growth are the bathroom and the kitchen.  Outdoor [employer] 1. Use air conditioning and keep windows closed 2. Avoid exposure to decaying vegetation. 3. Avoid leaf raking. 4. Avoid grain handling. 5. Consider wearing a face mask if working in moldy areas.  Indoor Mold Control 1. Maintain humidity below 50%. 2. Clean washable surfaces with 5% bleach solution. 3. Remove sources e.g. contaminated carpets.  Control of Dog or Cat Allergen  Avoidance is the best way to manage a dog or cat allergy. If you have a dog or cat and are allergic to dog or cats, consider removing the dog or cat from the home. If you have a dog or cat but don't want to find it a new home, or if your family wants a pet even though someone in the household is allergic, here are some strategies that may help keep symptoms at bay:  1. Keep the pet out of your bedroom and restrict it to only a few rooms. Be advised that keeping the dog or cat in only one room will not limit the allergens to that room. 2. Don't pet, hug or kiss the dog or cat; if you do, wash your hands with soap and water. 3. High-efficiency particulate air (HEPA) cleaners run continuously in a bedroom or living room can reduce allergen levels over time. 4. Regular use of a high-efficiency vacuum cleaner or a central vacuum can reduce allergen levels. 5. Giving your dog or cat a bath at least once a week can reduce airborne allergen.  Control of House Dust Mite Allergen    House dust mites play a major role in allergic asthma and rhinitis.  They occur in environments with high humidity wherever human skin, the food for dust mites is found. High levels have been detected in dust obtained from mattresses, pillows, carpets, upholstered furniture, bed covers, clothes and soft toys.  The principal allergen of the house dust mite is found in its feces.  A gram of dust may contain 1,000 mites and 250,000 fecal  particles.  Mite antigen is easily measured in the air during house cleaning activities.    1. Encase mattresses, including the box spring, and pillow, in an air tight cover.  Seal the zipper end of the encased mattresses with wide adhesive tape. 2. Wash the bedding in water of 130 degrees Farenheit weekly.  Avoid cotton comforters/quilts and flannel bedding: the most ideal bed covering is the dacron comforter. 3. Remove all upholstered furniture from the bedroom. 4. Remove carpets, carpet padding, rugs, and non-washable window drapes from the bedroom.  Wash drapes weekly or use plastic window coverings. 5. Remove all non-washable stuffed toys from the bedroom.  Wash stuffed toys weekly. 6. Have the room cleaned frequently with a vacuum cleaner and a damp dust-mop.  The patient should not be in a room which is being cleaned and should wait 1 hour after cleaning before going into the room. 7. Close and seal all heating outlets in the bedroom.  Otherwise, the room will become filled with dust-laden air.  An electric heater can be used to heat the room. 8. Reduce  indoor humidity to less than 50%.  Do not use a humidifier.

## 2017-08-15 NOTE — Progress Notes (Signed)
NEW PATIENT  Date of Service/Encounter:  08/15/17  Referring provider: Jani Gravel, MD   Assessment:   Seasonal and perennial allergic rhinitis  Chronic cough  Plan/Recommendations:   1. Chronic rhinitis - Testing today showed: ragweed, grasses, molds, dust mites and cat - Avoidance measures provided. - Continue with Qnasl one puff per nostril daily and Xyzal (levocetirizine) 5mg  tablet once daily (you can stop the Xyzal if you like the Ryvent instead) - Start ipratropium nasal spray one spray per nostril every 6 hours as needed and Ryvent (carbinoxamine) 6mg  tablet 3-4 times daily as needed - You can use an extra dose of the antihistamine, if needed, for breakthrough symptoms.  - Consider nasal saline rinses 1-2 times daily to remove allergens from the nasal cavities as well as help with mucous clearance (this is especially helpful to do before the nasal sprays are given) - Consider allergy shots as a means of long-term control. - Allergy shots "re-train" and "reset" the immune system to ignore environmental allergens and decrease the resulting immune response to those allergens (sneezing, itchy watery eyes, runny nose, nasal congestion, etc).    - Allergy shots improve symptoms in 75-85% of patients.  - We can discuss more at the next appointment if the medications are not working for you.   2. Chronic cough - Testing to the most common food allergens was negative (peanut, tree nut, soy, fish mix, shellfish mix, wheat, milk, egg) - Foods are not likely to be contributing to your coughing.,  - This cough is likely from a multitude of factors. - Continue with your reflux medications. - Hopefully controlling the postnasal drip will help with coughing.  3. Return in about 3 months (around 11/14/2017).  Subjective:   Charlotte Roth is a 56 y.o. female presenting today for evaluation of  Chief Complaint  Patient presents with  . Cough  . Nasal Congestion  . Wheezing  .  Shortness of Breath    Charlotte Roth has a history of the following: Patient Active Problem List   Diagnosis Date Noted  . Seasonal and perennial allergic rhinitis 08/15/2017  . Acute bronchitis 02/17/2017  . Hemoptysis 02/17/2017  . Post-nasal drip 07/25/2016  . Esophageal reflux 07/25/2016  . Irritable larynx 03/08/2016  . Chronic cough 05/12/2011    History obtained from: chart review and patient.  Charlotte Roth was referred by Jani Gravel, MD.     Charlotte Roth is a 56 y.o. female presenting for evaluation of a chronic cough and was an allergy evaluation. She reports that she is itching today. She has been on a multitude of antihistamines, currently on Xyzal. She has been on antihistamines for years. She was initially placed on it due to this cough, which does help somewhat. She is currently on Singulair as well. She does endorse nasal congestion with postnasal drip. This occurs throughout the year. This worsens in the fall and the spring. She has been allergy tested in the past, with the last time occurring 5-6 years ago at [facility]; she was not started on shots at all. At that time, she was positive only to mold. She was not tested for foods according to the patient. It is not clear whether she is more concerned with foods or environmental allergens. She was on shots for 6-7 years with Dr. Leonides Schanz in [place] (around age 34). She actually was good through her 42s and 30s, but then symptoms worsened in her 62s. She remained rather asymptomatic for a period of  10-15 years.    She is followed by Dr. Benjamine Mola. She has never had surgery in her sinuses, but she seems him for two years. She reports that he has performed nasal endoscopies which were largely unrevealing. She seems to like Dr. Benjamine Mola quite a bit.   This chronic cough presents mainly during the day and rarely interrupts her life. Cough is triggered by odors and hot temperatures and cold temperatures and even with beginning to eat. She has had an  extensive workup for this in the past. She had a negative methacholine challenge. She has been placed on antihistamines, gabapentin, omeprazole, ranitidine, and Qnasl, all without relief. She is followed by Dr. Chase Caller. She feels that she may have developed asthma, although she had a negative methacholine challenge in 2012. She was put on an inhaler years ago to take every day (? Qvar), but it made her cough even worse.   She has undergone a rheumatologic workup which was negative aside from a positive ANA. She underwent an endoscopy in December 2017 that noted a few gastric polyps, but was otherwise normal. She went to see Dr. Dennison Mascot ([facility]) in February 2018 for evaluation of a chronic cough. 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 At that time, amitriptyline was added as well as Neilmed daily.    Otherwise, there is no history of other atopic diseases, including drug allergies, food allergies, stinging insect allergies, or urticaria. There is no significant infectious history. Vaccinations are up to date.    Past Medical History: Patient Active Problem List   Diagnosis Date Noted  . Seasonal and perennial allergic rhinitis 08/15/2017  . Acute bronchitis 02/17/2017  . Hemoptysis 02/17/2017  . Post-nasal drip 07/25/2016  . Esophageal reflux 07/25/2016  . Irritable larynx 03/08/2016  . Chronic cough 05/12/2011    Medication List:  Allergies as of 08/15/2017      Reactions   Codeine Nausea And Vomiting      Medication List       Accurate as of 08/15/17  1:18 PM. Always use your most recent med list.          gabapentin 300 MG capsule Commonly known as:  NEURONTIN TAKE ONE CAPSULE BY MOUTH THREE TIMES DAILY   levocetirizine 5 MG tablet Commonly known as:  XYZAL Take 5 mg by mouth daily.   montelukast 10 MG tablet Commonly known as:  SINGULAIR Take 10 mg by  mouth at bedtime.   omeprazole 20 MG tablet Commonly known as:  PRILOSEC OTC Take 20 mg by mouth daily.   PROAIR HFA 108 (90 Base) MCG/ACT inhaler Generic drug:  albuterol Inhale 2 puffs into the lungs as needed.   promethazine-codeine 6.25-10 MG/5ML syrup Commonly known as:  PHENERGAN with CODEINE Take 5 mLs by mouth daily as needed for cough.   QNASL 80 MCG/ACT Aers Generic drug:  Beclomethasone Dipropionate Place 1 spray into the nose 2 (two) times daily.   ZANTAC 150 MAXIMUM STRENGTH 150 MG tablet Generic drug:  ranitidine Take 150 mg by mouth every evening.       Birth History: non-contributory.   Developmental History: non-contributory.   Past Surgical History: Past Surgical History:  Procedure Laterality Date  . BRAVO [place] STUDY N/A 11/23/2016   Procedure: BRAVO [place];  Surgeon: Arta Silence, MD;  Location: [facility];  Service: Endoscopy;  Laterality: N/A;  . ESOPHAGOGASTRODUODENOSCOPY (EGD) WITH PROPOFOL N/A  11/23/2016   Procedure: ESOPHAGOGASTRODUODENOSCOPY (EGD) WITH PROPOFOL;  Surgeon: Arta Silence, MD;  Location: [facility];  Service: Endoscopy;  Laterality: N/A;  . OOPHORECTOMY  age 28   left ovary"open bikini cut"  . TONSILLECTOMY       Family History: Family History  Problem Relation Age of Onset  . Asthma Mother   . Allergic rhinitis Mother   . Pulmonary fibrosis Father   . Asthma Maternal Grandfather      Social History: Chontel lives at home with her husband. They have been married for 35 years. They do not have children but do have two cats for the past ten years. Prior to this, she had dogs. Animals have not seemed to have bothered her at all. They live in a house with wood in the main living areas and carpeting in the bedrooms. They have electric heating and central cooling. There are no dust mite coverings on the bedding and no tobacco exposure. 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       Review of Systems: a 14-point review of systems is pertinent for what is mentioned in HPI.  Otherwise, all other systems were negative. Constitutional: negative other than that listed in the HPI Eyes: negative other than that listed in the HPI Ears, nose, mouth, throat, and face: negative other than that listed in the HPI Respiratory: negative other than that listed in the HPI Cardiovascular: negative other than that listed in the HPI Gastrointestinal: negative other than that listed in the HPI Genitourinary: negative other than that listed in the HPI Integument: negative other than that listed in the HPI Hematologic: negative other than that listed in the HPI Musculoskeletal: negative other than that listed in the HPI Neurological: negative other than that listed in the HPI Allergy/Immunologic: negative other than that listed in the HPI    Objective:   Blood pressure (!) 140/100, pulse 89, temperature 97.8 F (36.6 C), temperature source Oral, resp. rate 17, height 5' 4.57" (1.64 m), weight 206 lb 12.8 oz (93.8 kg), last menstrual period 10/25/2011, SpO2 98 %. Body mass index is 34.88 kg/m.   Physical Exam:  General: Alert, interactive, in no acute distress. Obese. Pleasant female. Eyes: No conjunctival injection present on the right, No conjunctival injection present on the left, PERRL bilaterally, No discharge on the right, No discharge on the left and No Horner-Trantas dots present Ears: Right TM pearly gray with normal light reflex, Left TM pearly gray with normal light reflex, Right TM intact without perforation and Left TM intact without perforation.  Nose/Throat: External nose within normal limits, nasal crease present and septum midline, turbinates edematous with clear discharge, post-pharynx mildly erythematous with cobblestoning in the posterior oropharynx. Tonsils 2+ without exudates Neck: Supple without thyromegaly.  Adenopathy: shoddy bilateral anterior cervical  lymphadenopathy. and no enlarged lymph nodes appreciated in the occipital, axillary, epitrochlear, inguinal, or popliteal regions. Lungs: Decreased breath sounds bilaterally without rhonchi or rales. There is one isolated expiratory wheeze in the right posterior field. No increased work of breathing. CV: Normal S1/S2, no murmurs. Capillary refill <2 seconds.  Abdomen: Nondistended, nontender. No guarding or rebound tenderness. Bowel sounds present in all fields and hypoactive  Skin: Warm and dry, without lesions or rashes. Extremities:  No clubbing, cyanosis or edema. Neuro:   Grossly intact. No focal deficits appreciated. Responsive to questions.  Diagnostic studies:  Spirometry: results normal (FEV1: 1.96/78%, FVC: 2.27/77%, FEV1/FVC: 86%).    Spirometry consistent with normal  pattern.   Allergy Studies:   Indoor/Outdoor Percutaneous Adult Environmental Panel: positive to sweet vernal grass and burweed marsh elder. Otherwise negative with adequate controls.  Indoor/Outdoor Selected Intradermal Environmental Panel: positive to [place] grass, ragweed mix, mold mix #1, mold mix #2, cat and mite mix. Otherwise negative with adequate controls.  Most Common Foods Panel (peanut, cashew, soy, fish mix, shellfish mix, wheat, milk, egg): negative to all with adequate controls.       Salvatore Marvel, MD [facility]

## 2017-11-22 DIAGNOSIS — M25552 Pain in left hip: Secondary | ICD-10-CM | POA: Diagnosis not present

## 2017-11-22 DIAGNOSIS — Z1211 Encounter for screening for malignant neoplasm of colon: Secondary | ICD-10-CM | POA: Diagnosis not present

## 2017-11-22 DIAGNOSIS — R05 Cough: Secondary | ICD-10-CM | POA: Diagnosis not present

## 2017-12-19 ENCOUNTER — Encounter: Payer: Self-pay | Admitting: Allergy & Immunology

## 2017-12-19 ENCOUNTER — Ambulatory Visit: Payer: BLUE CROSS/BLUE SHIELD | Admitting: Allergy & Immunology

## 2017-12-19 VITALS — BP 138/80 | HR 92 | Resp 18

## 2017-12-19 DIAGNOSIS — J3089 Other allergic rhinitis: Secondary | ICD-10-CM | POA: Diagnosis not present

## 2017-12-19 DIAGNOSIS — R05 Cough: Secondary | ICD-10-CM | POA: Diagnosis not present

## 2017-12-19 DIAGNOSIS — T781XXD Other adverse food reactions, not elsewhere classified, subsequent encounter: Secondary | ICD-10-CM

## 2017-12-19 DIAGNOSIS — J302 Other seasonal allergic rhinitis: Secondary | ICD-10-CM

## 2017-12-19 DIAGNOSIS — R053 Chronic cough: Secondary | ICD-10-CM

## 2017-12-19 NOTE — Patient Instructions (Addendum)
1. Perennial and seasonal allergic rhinitis (ragweed, grasses, molds, dust mites and cat) - Continue with Qnasl one puff per nostril daily  - Continue with ipratropium nasal spray one spray per nostril every 6 hours as needed and Ryvent (carbinoxamine) 6mg  tablet 3-4 times daily as needed - Call your insurance company again and continue to ask for the next level up.  - This should be an easy enough answer.    2. Chronic cough - We will start you on an inhaler steroid to see if this helps with the coughing: Asmanex 169mcg two puffs twice daily with spacer - This will answer whether or not you have asthma. - If you are not improving at the end of one month, we will move forward with allergy shots.   3. Return in about 4 weeks (around 01/16/2018).   Please inform us of any Emergency Department visits, hospitalizations, or changes in symptoms. Call us before going to the ED for breathing or allergy symptoms since we might be able to fit you in for a sick visit. Feel free to contact us anytime with any questions, problems, or concerns.  It was a pleasure to see you again today! Happy New Year!   Websites that have reliable patient information: 1. American Academy of Asthma, Allergy, and Immunology: www.aaaai.org 2. Food Allergy Research and Education (FARE): foodallergy.org 3. Mothers of Asthmatics: http://www.asthmacommunitynetwork.org 4. American College of Allergy, Asthma, and Immunology: www.acaai.org

## 2017-12-19 NOTE — Progress Notes (Signed)
FOLLOW UP  Date of Service/Encounter:  12/19/17   Assessment:   Seasonal and perennial allergic rhinitis (ragweed, grasses, molds, dust mites and cat)  Chronic cough - ? asthma  Plan/Recommendations:   1. Perennial and seasonal allergic rhinitis (ragweed, grasses, molds, dust mites and cat) - Continue with Qnasl one puff per nostril daily and Xyzal (levocetirizine) 5mg  tablet once daily  - Continue with ipratropium nasal spray one spray per nostril every 6 hours as needed and Ryvent (carbinoxamine) 6mg  tablet 3-4 times daily as needed - Call your insurance company again and continue to ask for the next level up.  - This should be an easy enough answer.    2. Chronic cough - We will start you on an inhaler steroid to see if this helps with the coughing: Asmanex 173mcg two puffs twice daily with spacer - This will answer whether or not you have asthma. - If you are not improving at the end of one month, we will move forward with allergy shots.   3. Return in about 4 weeks (around 01/16/2018).  Subjective:   Charlotte Roth is a 57 y.o. female presenting today for follow up of  Chief Complaint  Patient presents with  . Cough    Productive cough on occ.  Had URI 2 weeks ago  . Nasal Congestion    Charlotte Roth has a history of the following: Patient Active Problem List   Diagnosis Date Noted  . Seasonal and perennial allergic rhinitis 08/15/2017  . Acute bronchitis 02/17/2017  . Hemoptysis 02/17/2017  . Post-nasal drip 07/25/2016  . Esophageal reflux 07/25/2016  . Irritable larynx 03/08/2016  . Chronic cough 05/12/2011    History obtained from: chart review and patient.  Charlotte Roth's Primary Care Provider is Jani Gravel, MD.     Charlotte Roth is a 57 y.o. female presenting for a follow up visit. She was last seen in September 2018 as a new patient. She had a history of a chronic cough. We did environmental allergy testing that demonstrated sensitizations to ragweed,  grasses, molds, dust mites, and cat. We continued her on Qnasl and Xyzal. We also started Atrovent nasal spray and Ryvent 6mg  as needed. She was coughing and felt that foods were related, but testing to the most common foods was negative. I recommended continuing her reflux medication. We were hopeful that controlling her postnasal drip would improve the cough. Spirometry was normal.   Since the last visit, she has mostly done well. She does think that the coughing was better following our visit last time. She has been on the nasal sprays as well as the Ryvent. She was given Xyzal but felt that the Ryvent worked better.   She did have a coughing episode in early January and was started on azithromycin via an e-visit. This did not help much, unfortunately. She was also given a codeine containing cough medicine and did not tolerate this due to abdominal pain and nausea. In addition, it did not help the cough. She has continued to cough despite all of these medications. Overall the symptoms seem to be getting slightly better.  She remains on omeprazole in the AM and ranitidine in the PM. She feels that her reflux is under good control. She denies chest pain and problems after eating. She denies any problems with worsening symptoms with meals. She has been on this regimen for several years.   She does have a history of asthma when she was much younger. Then  she was told that she did not have asthma. She has never been on a daily medication for her asthma. She has never needed prednisone for her asthma at all. Her diagnosis is not entirely clear from an asthma stand point. She has never seen a pulmonologist.   Otherwise, there have been no changes to her past medical history, surgical history, family history, or social history.    Review of Systems: a 14-point review of systems is pertinent for what is mentioned in HPI.  Otherwise, all other systems were negative. Constitutional: negative other than that  listed in the HPI Eyes: negative other than that listed in the HPI Ears, nose, mouth, throat, and face: negative other than that listed in the HPI Respiratory: negative other than that listed in the HPI Cardiovascular: negative other than that listed in the HPI Gastrointestinal: negative other than that listed in the HPI Genitourinary: negative other than that listed in the HPI Integument: negative other than that listed in the HPI Hematologic: negative other than that listed in the HPI Musculoskeletal: negative other than that listed in the HPI Neurological: negative other than that listed in the HPI Allergy/Immunologic: negative other than that listed in the HPI    Objective:   Blood pressure 138/80, pulse 92, resp. rate 18, last menstrual period 10/25/2011, SpO2 96 %. There is no height or weight on file to calculate BMI.   Physical Exam:  General: Alert, interactive, in no acute distress. Pleasant female. Smiling. Eyes: No conjunctival injection bilaterally, no discharge on the right, no discharge on the left and no Horner-Trantas dots present. PERRL bilaterally. EOMI without pain. No photophobia.  Ears: Right TM pearly gray with normal light reflex, Left TM pearly gray with normal light reflex, Right TM intact without perforation and Left TM intact without perforation.  Nose/Throat: External nose within normal limits and septum midline. Turbinates edematous and pale with clear discharge. Posterior oropharynx erythematous with cobblestoning in the posterior oropharynx. Tonsils 3+ without exudates.  Tongue without thrush. Adenopathy: no enlarged lymph nodes appreciated in the anterior cervical, occipital, axillary, epitrochlear, inguinal, or popliteal regions. Lungs: Clear to auscultation without wheezing, rhonchi or rales. No increased work of breathing. CV: Normal S1/S2. No murmurs. Capillary refill <2 seconds.  Skin: Warm and dry, without lesions or rashes. Neuro:   Grossly  intact. No focal deficits appreciated. Responsive to questions.  Diagnostic studies:   Spirometry: results normal (FEV1: 2.18/87%, FVC: 2.58/88%, FEV1/FVC: 84%).    Spirometry consistent with normal pattern. Overall values are better than the last visit.   Allergy Studies: none    Salvatore Marvel, MD [facility]

## 2017-12-29 ENCOUNTER — Other Ambulatory Visit: Payer: Self-pay | Admitting: Allergy & Immunology

## 2018-01-24 DIAGNOSIS — J329 Chronic sinusitis, unspecified: Secondary | ICD-10-CM | POA: Diagnosis not present

## 2018-01-24 DIAGNOSIS — J111 Influenza due to unidentified influenza virus with other respiratory manifestations: Secondary | ICD-10-CM | POA: Diagnosis not present

## 2018-01-24 DIAGNOSIS — J301 Allergic rhinitis due to pollen: Secondary | ICD-10-CM | POA: Diagnosis not present

## 2018-01-24 DIAGNOSIS — R05 Cough: Secondary | ICD-10-CM | POA: Diagnosis not present

## 2018-01-24 DIAGNOSIS — J069 Acute upper respiratory infection, unspecified: Secondary | ICD-10-CM | POA: Diagnosis not present

## 2018-01-26 ENCOUNTER — Other Ambulatory Visit: Payer: Self-pay | Admitting: Internal Medicine

## 2018-01-30 ENCOUNTER — Ambulatory Visit: Payer: BLUE CROSS/BLUE SHIELD | Admitting: Allergy & Immunology

## 2018-01-30 ENCOUNTER — Encounter: Payer: Self-pay | Admitting: Allergy & Immunology

## 2018-01-30 VITALS — BP 132/80 | HR 76 | Resp 17

## 2018-01-30 DIAGNOSIS — J302 Other seasonal allergic rhinitis: Secondary | ICD-10-CM | POA: Diagnosis not present

## 2018-01-30 DIAGNOSIS — R05 Cough: Secondary | ICD-10-CM

## 2018-01-30 DIAGNOSIS — K219 Gastro-esophageal reflux disease without esophagitis: Secondary | ICD-10-CM

## 2018-01-30 DIAGNOSIS — J3089 Other allergic rhinitis: Secondary | ICD-10-CM

## 2018-01-30 DIAGNOSIS — R053 Chronic cough: Secondary | ICD-10-CM

## 2018-01-30 MED ORDER — MONTELUKAST SODIUM 10 MG PO TABS: 10.0000 mg | ORAL_TABLET | Freq: Every day | ORAL | 5 refills | Status: DC

## 2018-01-30 MED ORDER — CARBINOXAMINE MALEATE 6 MG PO TABS: 1.0000 | ORAL_TABLET | Freq: Two times a day (BID) | ORAL | 5 refills | Status: DC | PRN

## 2018-01-30 MED ORDER — PROAIR HFA 108 (90 BASE) MCG/ACT IN AERS: 2.0000 | INHALATION_SPRAY | RESPIRATORY_TRACT | 1 refills | Status: DC | PRN

## 2018-01-30 MED ORDER — RANITIDINE HCL 150 MG PO TABS: 150.0000 mg | ORAL_TABLET | Freq: Every evening | ORAL | 5 refills | Status: DC

## 2018-01-30 NOTE — Progress Notes (Signed)
FOLLOW UP  Date of Service/Encounter:  01/30/18   Assessment:   Seasonal and perennial allergic rhinitis (ragweed, grasses, molds, dust mites and cat)  Chronic cough - ? Asthma  GERD - controlled on PPI and H2 blockade  Plan/Recommendations:   1. Perennial and seasonal allergic rhinitis (ragweed, grasses, molds, dust mites and cat) - Continue with Qnasl one puff per nostril daily  - Continue with ipratropium nasal spray one spray per nostril every 6 hours as needed and Ryvent (carbinoxamine) 6mg  tablet 3-4 times daily as needed - Call your insurance company again and continue to ask for the next level up.  - This should be an easy enough answer.    2. Chronic cough - not responsive to inhaled steroids - Add on the Ryvent in the afternoons to see if this helps. - OK to stop the Asmanex since this made it worse.  - With the worsening symptoms with the ICS exposure, I am not convinced that this is asthma at all.  - This could be related to reactive airway dysfunction syndrome, which is an irritating reactions for scents and other triggers in the air, as opposed to distinct asthma.   3. GERD - Continue with omeprazole and ranitidine.   4. Return in about 3 months (around 04/29/2018).  Subjective:   Charlotte Roth is a 57 y.o. female presenting today for follow up of  Chief Complaint  Patient presents with  . Asthma  . Allergic Rhinitis   . Follow-up  . Cough    Charlotte Roth has a history of the following: Patient Active Problem List   Diagnosis Date Noted  . Seasonal and perennial allergic rhinitis 08/15/2017  . Acute bronchitis 02/17/2017  . Hemoptysis 02/17/2017  . Post-nasal drip 07/25/2016  . Esophageal reflux 07/25/2016  . Irritable larynx 03/08/2016  . Chronic cough 05/12/2011    History obtained from: chart review and patient.  Charlotte Roth's Primary Care Provider is Jani Gravel, MD.     Charlotte Roth is a 57 y.o. female presenting for a follow up visit.  She was last seen one month ago. At the time, her allergic rhinitis symptoms were well controlled with Qnasl one puff per nostril daily and Xyzal 5mg  daily. We also continued her on nasal ipratropium one spray per nostril every 6-8 hours as needed as well as Ryvent 6mg  daily as needed. She was interested in allergy shots, but evidently was getting the run around from the insurance company regarding coverage. She also had a chronic cough for which we started Asmanex 175mcg two puffs BID to treat presumed asthma.  Since the last visit, she has mostly done well. She did take the Asmanex for two weeks, but this actually seemed to make it worse. She was diagnosed with a sinus infection last week and given a script for antibiotics but improved without even taking the antibiotic. She has had no prednisone courses and has not needed to go to the ED for any symptoms.   Charlotte Roth continues to have a cough that occurs regularly around 3:30pm or 4:00pm, she will start having the coughing spells. She does have Ryvent which she takes in the morning around 7am. She experiences no sedation from this at all. But something around 4pm tends to trigger the coughing incessantly. She is unsure of the trigger, but has not tried taking any extra doses of Ryvent for these symptoms.   Otherwise, there have been no changes to her past medical history, surgical history,  family history, or social history.    Review of Systems: a 14-point review of systems is pertinent for what is mentioned in HPI.  Otherwise, all other systems were negative. Constitutional: negative other than that listed in the HPI Eyes: negative other than that listed in the HPI Ears, nose, mouth, throat, and face: negative other than that listed in the HPI Respiratory: negative other than that listed in the HPI Cardiovascular: negative other than that listed in the HPI Gastrointestinal: negative other than that listed in the HPI Genitourinary: negative  other than that listed in the HPI Integument: negative other than that listed in the HPI Hematologic: negative other than that listed in the HPI Musculoskeletal: negative other than that listed in the HPI Neurological: negative other than that listed in the HPI Allergy/Immunologic: negative other than that listed in the HPI    Objective:   Blood pressure 132/80, pulse 76, resp. rate 17, last menstrual period 10/25/2011, SpO2 93 %. There is no height or weight on file to calculate BMI.   Physical Exam:  General: Alert, interactive, in no acute distress. Pleasant female. Talkative as always. Well appearing.  Eyes: No conjunctival injection bilaterally, no discharge on the right, no discharge on the left and no Horner-Trantas dots present. PERRL bilaterally. EOMI without pain. No photophobia.  Ears: Right TM pearly gray with normal light reflex, Left TM pearly gray with normal light reflex, Right TM intact without perforation and Left TM intact without perforation.  Nose/Throat: External nose within normal limits, nasal crease present and septum midline. Turbinates edematous and pale with clear discharge. Posterior oropharynx mildly erythematous without cobblestoning in the posterior oropharynx. Tonsils 2+ without exudates.  Tongue without thrush. Lungs: Clear to auscultation without wheezing, rhonchi or rales. No increased work of breathing. CV: Normal S1/S2. No murmurs. Capillary refill <2 seconds.  Skin: Warm and dry, without lesions or rashes. Neuro:   Grossly intact. No focal deficits appreciated. Responsive to questions.  Diagnostic studies:   Spirometry: results normal (FEV1: 2.09/87%, FVC: 2.44/76%, FEV1/FVC: 85%).    Spirometry consistent with normal pattern.   Allergy Studies: none       Salvatore Marvel, MD [facility]

## 2018-01-30 NOTE — Patient Instructions (Addendum)
1. Perennial and seasonal allergic rhinitis (ragweed, grasses, molds, dust mites and cat) - Continue with Qnasl one puff per nostril daily  - Continue with ipratropium nasal spray one spray per nostril every 6 hours as needed and Ryvent (carbinoxamine) 6mg  tablet 3-4 times daily as needed - Call your insurance company again and continue to ask for the next level up.  - This should be an easy enough answer.    2. Chronic cough - not responsive to inhaled steroids - Add on the Ryvent in the afternoons to see if this helps. - OK to stop the Asmanex since this made it worse.   3. GERD - Continue with omeprazole and ranitidine.   4. Return in about 3 months (around 04/29/2018).   Please inform us of any Emergency Department visits, hospitalizations, or changes in symptoms. Call us before going to the ED for breathing or allergy symptoms since we might be able to fit you in for a sick visit. Feel free to contact us anytime with any questions, problems, or concerns.  It was a pleasure to see you again today!   Websites that have reliable patient information: 1. American Academy of Asthma, Allergy, and Immunology: www.aaaai.org 2. Food Allergy Research and Education (FARE): foodallergy.org 3. Mothers of Asthmatics: http://www.asthmacommunitynetwork.org 4. American College of Allergy, Asthma, and Immunology: www.acaai.org

## 2018-01-31 ENCOUNTER — Encounter: Payer: Self-pay | Admitting: Allergy & Immunology

## 2018-04-03 DIAGNOSIS — Z6835 Body mass index (BMI) 35.0-35.9, adult: Secondary | ICD-10-CM | POA: Diagnosis not present

## 2018-04-03 DIAGNOSIS — Z1231 Encounter for screening mammogram for malignant neoplasm of breast: Secondary | ICD-10-CM | POA: Diagnosis not present

## 2018-04-03 DIAGNOSIS — Z01419 Encounter for gynecological examination (general) (routine) without abnormal findings: Secondary | ICD-10-CM | POA: Diagnosis not present

## 2018-04-04 ENCOUNTER — Other Ambulatory Visit: Payer: Self-pay | Admitting: Obstetrics and Gynecology

## 2018-04-04 DIAGNOSIS — R928 Other abnormal and inconclusive findings on diagnostic imaging of breast: Secondary | ICD-10-CM

## 2018-04-11 ENCOUNTER — Ambulatory Visit
Admission: RE | Admit: 2018-04-11 | Discharge: 2018-04-11 | Disposition: A | Discharge status: Home / Self Care | Payer: BLUE CROSS/BLUE SHIELD | Source: Ambulatory Visit | Attending: Obstetrics and Gynecology | Admitting: Obstetrics and Gynecology

## 2018-04-11 DIAGNOSIS — R928 Other abnormal and inconclusive findings on diagnostic imaging of breast: Secondary | ICD-10-CM | POA: Diagnosis not present

## 2018-04-11 DIAGNOSIS — N6489 Other specified disorders of breast: Secondary | ICD-10-CM | POA: Diagnosis not present

## 2018-05-08 ENCOUNTER — Ambulatory Visit: Payer: BLUE CROSS/BLUE SHIELD | Admitting: Allergy & Immunology

## 2018-05-08 ENCOUNTER — Encounter: Payer: Self-pay | Admitting: Allergy & Immunology

## 2018-05-08 VITALS — BP 126/76 | HR 76 | Resp 18

## 2018-05-08 DIAGNOSIS — R05 Cough: Secondary | ICD-10-CM | POA: Diagnosis not present

## 2018-05-08 DIAGNOSIS — K219 Gastro-esophageal reflux disease without esophagitis: Secondary | ICD-10-CM

## 2018-05-08 DIAGNOSIS — J3089 Other allergic rhinitis: Secondary | ICD-10-CM

## 2018-05-08 DIAGNOSIS — R053 Chronic cough: Secondary | ICD-10-CM

## 2018-05-08 DIAGNOSIS — J302 Other seasonal allergic rhinitis: Secondary | ICD-10-CM | POA: Diagnosis not present

## 2018-05-08 NOTE — Progress Notes (Signed)
FOLLOW UP  Date of Service/Encounter:  05/08/18   Assessment:   Chronic cough  Gastroesophageal reflux disease  Seasonal and perennial allergic rhinitis (ragweed, grasses, molds, dust mites and cat)  Plan/Recommendations:   1. Perennial and seasonal allergic rhinitis (ragweed, grasses, molds, dust mites and cat) - Continue with Qnasl one puff per nostril daily  - Continue with ipratropium nasal spray one spray per nostril every 6 hours as needed and Ryvent (carbinoxamine) 6mg  tablet 3-4 times daily as needed - I did offer prednisone burst to help control her symptoms in the short term, but she declined. - I did not think that an antibiotic was warranted given the time frame of the symptoms as well the clear rhinorrhea that is consistent with allergic rhinitis.  - Continue to call your insurance company (form provided with the CPT codes).    2. Chronic cough - not responsive to inhaled steroids - Continue with the Ryvent in the afternoons since this seems to be helping.   - I think a lot of her problems are related to postnasal drip, which needs addressing either by removal of the cats from the bedroom or allergy shots. - Her physical exam is notable for uncontrolled allergic rhinitis.  3. GERD - Continue with omeprazole and ranitidine.   4. Return in about 6 months (around 11/07/2018).  Subjective:   Charlotte Roth is a 57 y.o. female presenting today for follow up of  Chief Complaint  Patient presents with  . Cough    Charlotte Roth has a history of the following: Patient Active Problem List   Diagnosis Date Noted  . Seasonal and perennial allergic rhinitis 08/15/2017  . Acute bronchitis 02/17/2017  . Hemoptysis 02/17/2017  . Post-nasal drip 07/25/2016  . Esophageal reflux 07/25/2016  . Irritable larynx 03/08/2016  . Chronic cough 05/12/2011    History obtained from: chart review and patient.  Charlotte Roth's Primary Care Provider is Charlotte Gravel, MD.      Charlotte Roth is a 57 y.o. female presenting for a follow up visit.  She was last seen in February 2019.  At that time, we continued her nasal steroid and Atrovent 1 spray per nostril as needed.  We also added carbinoxamine 6 mg as needed to help with postnasal drip.  We again discussed allergy injections and provided information for her to call her insurance company.  Her chronic cough continued, and I had a felt that the addition of the carbinoxamine might help with it.  She has been on inhaled steroids on multiple occasions without any improvement.  Her spirometry has been normal in the past.  Her reflux was controlled with omeprazole and ranitidine.  Since the last visit, she has mostly done well. She did develop a scratchy throat since Sunday. They have been working outside this past weekend. They did rip up the carpeting in the bedroom and have replaced all of the duct. She is having constant rhinorrhea. She continues to have two cats at home that remain in the bedroom.   Otherwise, there have been no changes to her past medical history, surgical history, family history, or social history.    Review of Systems: a 14-point review of systems is pertinent for what is mentioned in HPI.  Otherwise, all other systems were negative. Constitutional: negative other than that listed in the HPI Eyes: negative other than that listed in the HPI Ears, nose, mouth, throat, and face: negative other than that listed in the HPI Respiratory: negative  other than that listed in the HPI Cardiovascular: negative other than that listed in the HPI Gastrointestinal: negative other than that listed in the HPI Genitourinary: negative other than that listed in the HPI Integument: negative other than that listed in the HPI Hematologic: negative other than that listed in the HPI Musculoskeletal: negative other than that listed in the HPI Neurological: negative other than that listed in the HPI Allergy/Immunologic: negative  other than that listed in the HPI    Objective:   Blood pressure 126/76, pulse 76, resp. rate 18, last menstrual period 10/25/2011, SpO2 97 %. There is no height or weight on file to calculate BMI.   Physical Exam:  General: Alert, interactive, in no acute distress. Smiling pleasant female.  Eyes: No conjunctival injection bilaterally, no discharge on the right, no discharge on the left and no Horner-Trantas dots present. PERRL bilaterally. EOMI without pain. No photophobia.  Ears: Right TM pearly gray with normal light reflex, Left TM pearly gray with normal light reflex, Right TM intact without perforation and Left TM intact without perforation.  Nose/Throat: External nose within normal limits and septum midline. Turbinates edematous with clear discharge. Posterior oropharynx erythematous without cobblestoning in the posterior oropharynx. Tonsils 2+ without exudates.  Tongue without thrush. Lungs: Clear to auscultation without wheezing, rhonchi or rales. No increased work of breathing. CV: Normal S1/S2. No murmurs. Capillary refill <2 seconds.  Skin: Warm and dry, without lesions or rashes. Neuro:   Grossly intact. No focal deficits appreciated. Responsive to questions.  Diagnostic studies:   Spirometry: results normal (FEV1: 1.99/83%, FVC: 2.34/73%, FEV1/FVC: 84%).    Spirometry consistent with normal pattern.   Allergy Studies: none  Allergy testing results were read and interpreted by myself, documented by clinical staff.      Salvatore Marvel, MD  [facility]

## 2018-05-08 NOTE — Patient Instructions (Addendum)
1. Perennial and seasonal allergic rhinitis (ragweed, grasses, molds, dust mites and cat) - Continue with Qnasl one puff per nostril daily  - Continue with ipratropium nasal spray one spray per nostril every 6 hours as needed and Ryvent (carbinoxamine) 6mg  tablet 3-4 times daily as needed - Continue to call your insurance company (form provided with the CPT codes).    2. Chronic cough - not responsive to inhaled steroids - Continue with the Ryvent in the afternoons since this seems to be helping.    3. GERD - Continue with omeprazole and ranitidine.   4. Return in about 6 months (around 11/07/2018).   Please inform us of any Emergency Department visits, hospitalizations, or changes in symptoms. Call us before going to the ED for breathing or allergy symptoms since we might be able to fit you in for a sick visit. Feel free to contact us anytime with any questions, problems, or concerns.  It was a pleasure to see you again today!   Websites that have reliable patient information: 1. American Academy of Asthma, Allergy, and Immunology: www.aaaai.org 2. Food Allergy Research and Education (FARE): foodallergy.org 3. Mothers of Asthmatics: http://www.asthmacommunitynetwork.org 4. American College of Allergy, Asthma, and Immunology: www.acaai.org

## 2018-05-18 NOTE — Progress Notes (Signed)
We received notification from Anicka that she is interested in pursuing allergen immunotherapy. Prescriptions written and routed to the Immunotherapy Team.   Salvatore Marvel, MD [facility]

## 2018-05-18 NOTE — Addendum Note (Signed)
Addended by: Valentina Shaggy on: 05/18/2018 04:28 PM   Modules accepted: Orders

## 2018-05-21 DIAGNOSIS — J301 Allergic rhinitis due to pollen: Secondary | ICD-10-CM | POA: Diagnosis not present

## 2018-05-21 NOTE — Progress Notes (Signed)
VIALS EXP 05-22-19

## 2018-05-22 DIAGNOSIS — J3089 Other allergic rhinitis: Secondary | ICD-10-CM | POA: Diagnosis not present

## 2018-05-23 ENCOUNTER — Ambulatory Visit: Payer: BLUE CROSS/BLUE SHIELD

## 2018-05-29 ENCOUNTER — Ambulatory Visit (INDEPENDENT_AMBULATORY_CARE_PROVIDER_SITE_OTHER): Payer: BLUE CROSS/BLUE SHIELD

## 2018-05-29 DIAGNOSIS — J309 Allergic rhinitis, unspecified: Secondary | ICD-10-CM

## 2018-05-29 MED ORDER — EPINEPHRINE 0.3 MG/0.3ML IJ SOAJ: 0.3000 mg | Freq: Once | INTRAMUSCULAR | 2 refills | Status: AC

## 2018-06-05 ENCOUNTER — Ambulatory Visit (INDEPENDENT_AMBULATORY_CARE_PROVIDER_SITE_OTHER): Payer: BLUE CROSS/BLUE SHIELD | Admitting: *Deleted

## 2018-06-05 DIAGNOSIS — J309 Allergic rhinitis, unspecified: Secondary | ICD-10-CM | POA: Diagnosis not present

## 2018-06-12 ENCOUNTER — Ambulatory Visit (INDEPENDENT_AMBULATORY_CARE_PROVIDER_SITE_OTHER): Payer: BLUE CROSS/BLUE SHIELD

## 2018-06-12 DIAGNOSIS — J309 Allergic rhinitis, unspecified: Secondary | ICD-10-CM

## 2018-06-19 ENCOUNTER — Ambulatory Visit (INDEPENDENT_AMBULATORY_CARE_PROVIDER_SITE_OTHER): Payer: BLUE CROSS/BLUE SHIELD

## 2018-06-19 DIAGNOSIS — J309 Allergic rhinitis, unspecified: Secondary | ICD-10-CM | POA: Diagnosis not present

## 2018-06-25 DIAGNOSIS — H6090 Unspecified otitis externa, unspecified ear: Secondary | ICD-10-CM | POA: Diagnosis not present

## 2018-06-25 DIAGNOSIS — J0100 Acute maxillary sinusitis, unspecified: Secondary | ICD-10-CM | POA: Diagnosis not present

## 2018-06-25 DIAGNOSIS — J301 Allergic rhinitis due to pollen: Secondary | ICD-10-CM | POA: Diagnosis not present

## 2018-07-03 ENCOUNTER — Ambulatory Visit (INDEPENDENT_AMBULATORY_CARE_PROVIDER_SITE_OTHER): Payer: BLUE CROSS/BLUE SHIELD

## 2018-07-03 DIAGNOSIS — J309 Allergic rhinitis, unspecified: Secondary | ICD-10-CM | POA: Diagnosis not present

## 2018-07-10 ENCOUNTER — Ambulatory Visit (INDEPENDENT_AMBULATORY_CARE_PROVIDER_SITE_OTHER): Payer: BLUE CROSS/BLUE SHIELD | Admitting: *Deleted

## 2018-07-10 DIAGNOSIS — J309 Allergic rhinitis, unspecified: Secondary | ICD-10-CM | POA: Diagnosis not present

## 2018-07-14 IMAGING — CT CT CHEST HIGH RESOLUTION W/O CM
2 of 7 series · 15 of 36 positions shown, 18 images · non-contrast
Comparison: High-resolution chest CT 03/21/2016.

CLINICAL DATA: 54-year-old female with possible mild mild
interstitial lung disease noted on prior chest CT. Followup study.

EXAM:
CT CHEST WITHOUT CONTRAST
TECHNIQUE: Multidetector CT imaging of the chest was performed following the
standard protocol without intravenous contrast. High resolution
imaging of the lungs, as well as inspiratory and expiratory imaging,
was performed.

[Series 6: high resolution · axial · 0.71mm/px · z∈[+1094,+1308]mm · 12 of 121 slices shown, 15 images]
[im 7/121  mediastinal]
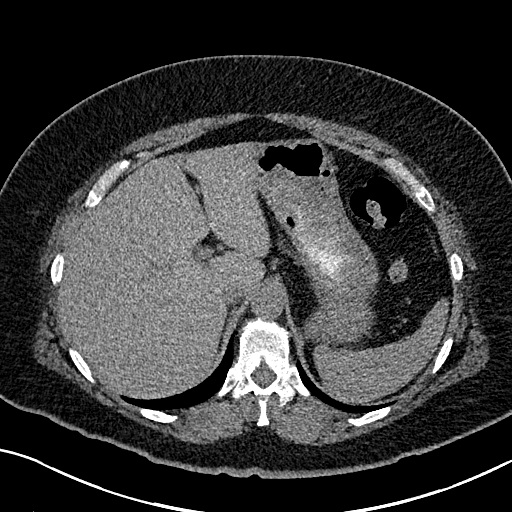
[im 7/121  lung]
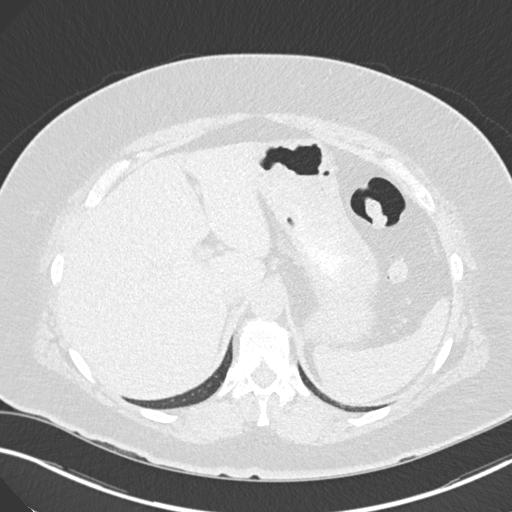
[im 21/121  lung]
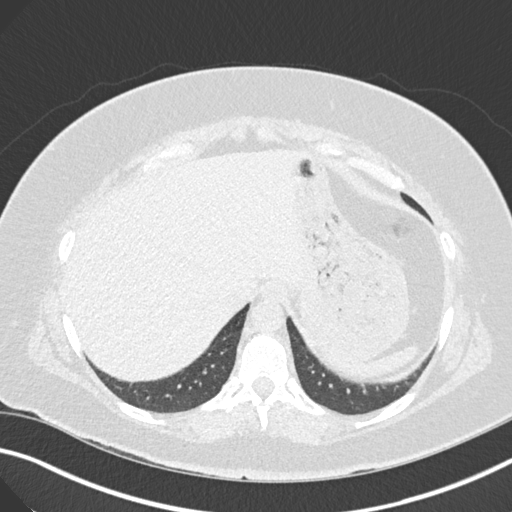
[im 27/121  lung]
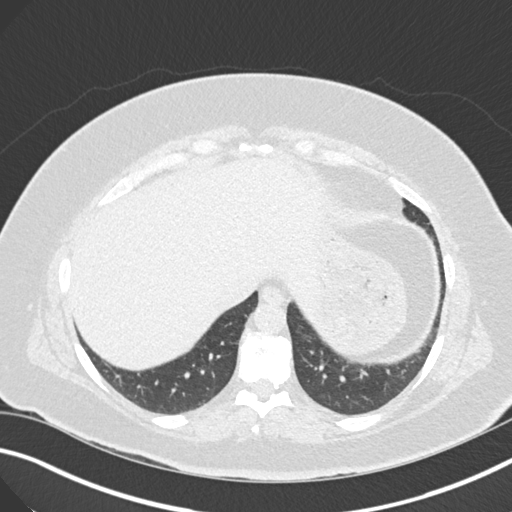
[im 34/121  lung]
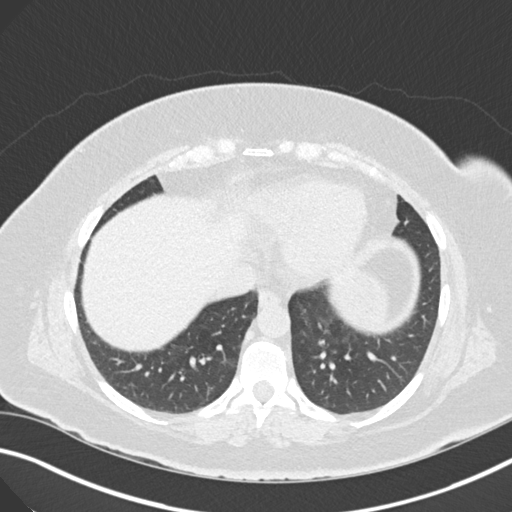
[im 47/121  mediastinal]
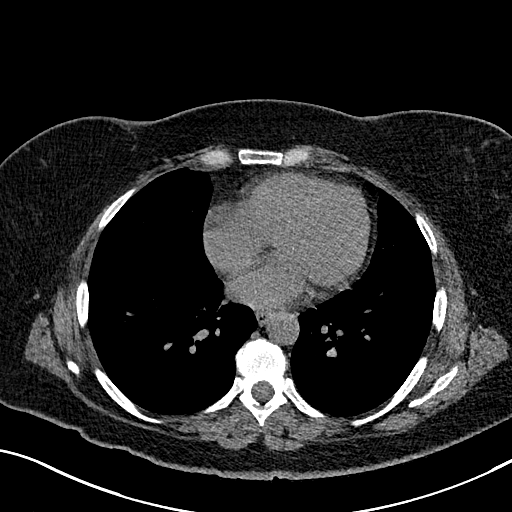
[im 47/121  lung]
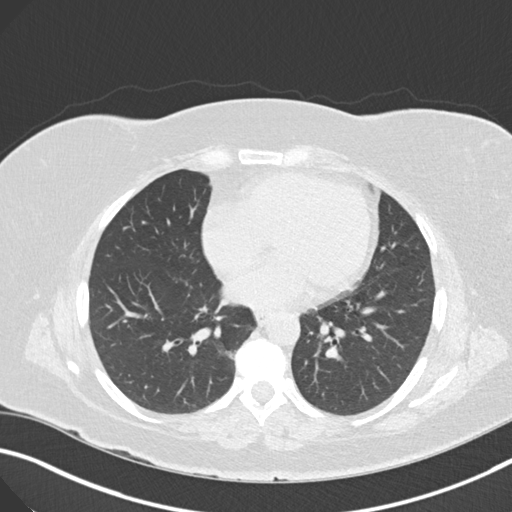
[im 54/121  lung]
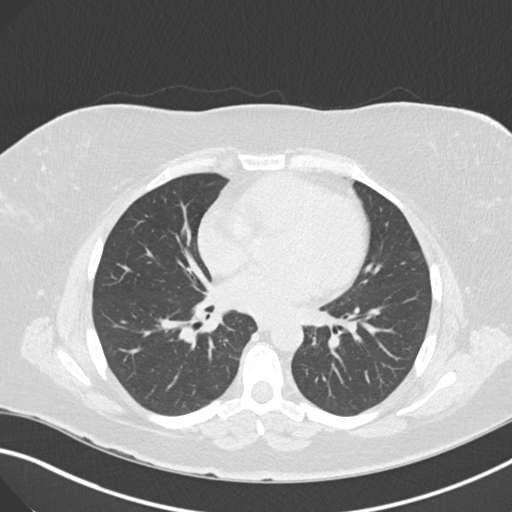
[im 67/121  lung]
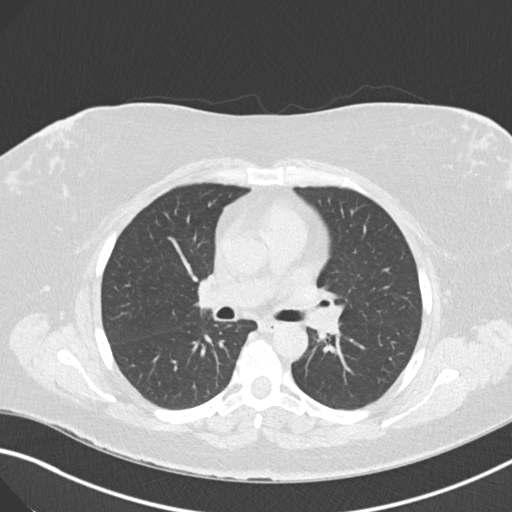
[im 74/121  lung]
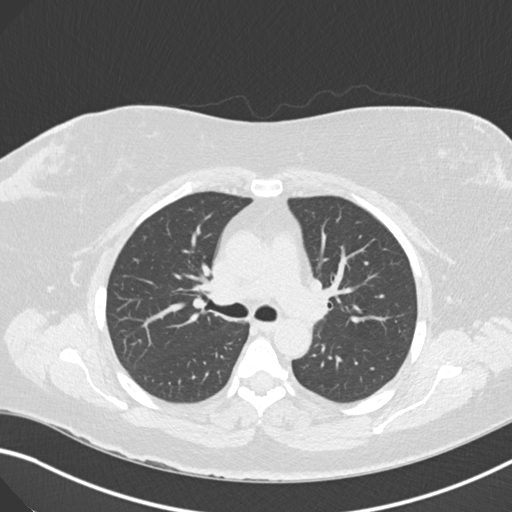
[im 87/121  mediastinal]
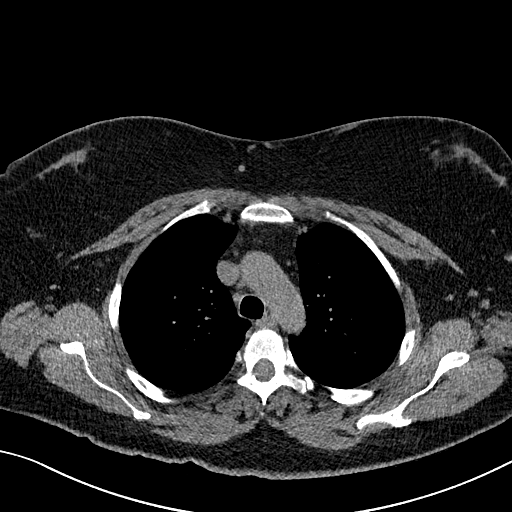
[im 87/121  lung]
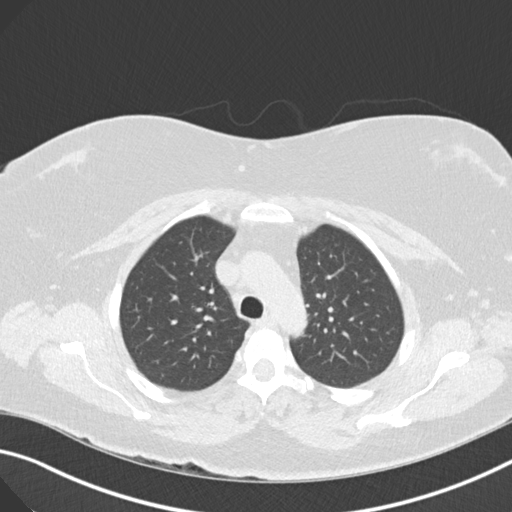
[im 94/121  lung]
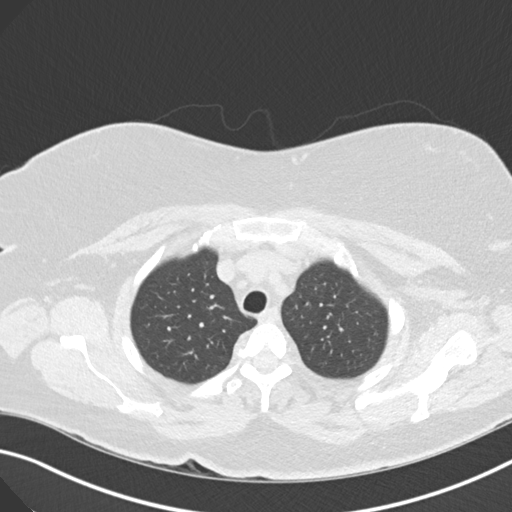
[im 101/121  lung]
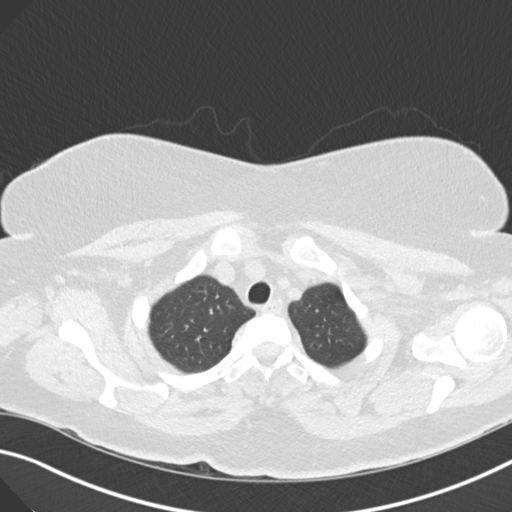
[im 114/121  lung]
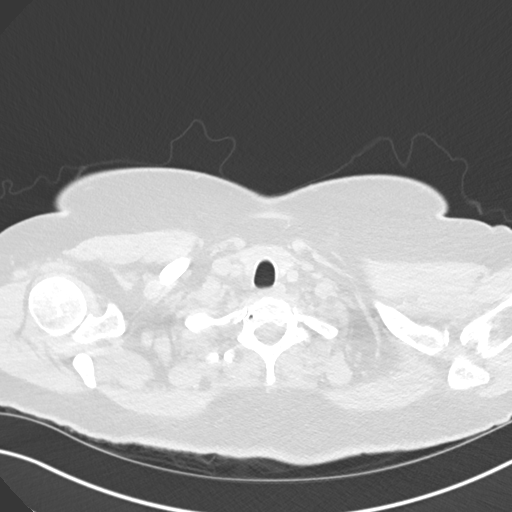

[Series 9: coronal · coronal · 0.59mm/px · 3 of 151 slices shown]
[im 31/151  lung]
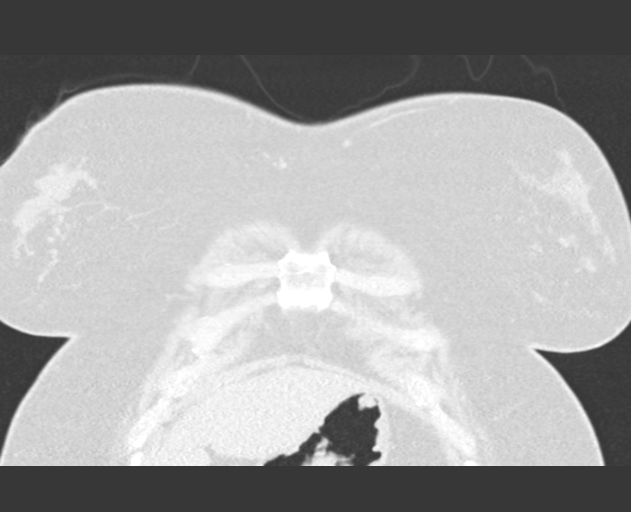
[im 61/151  lung]
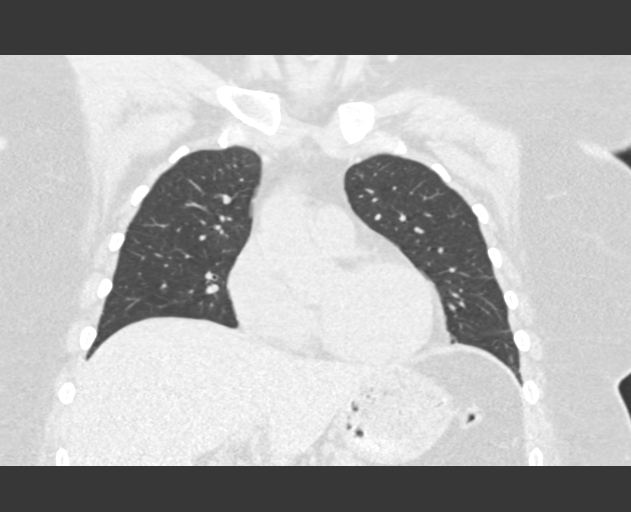
[im 91/151  lung]
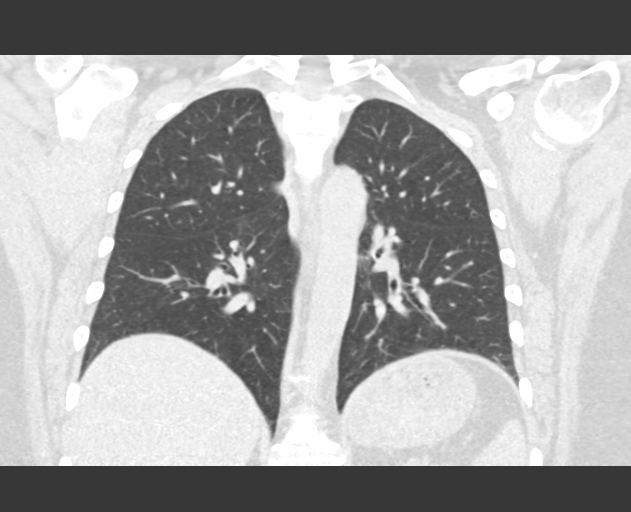

[15 of 36 positions shown; findings below may reference images not displayed]

FINDINGS: Cardiovascular: Heart size is normal. There is no significant
pericardial fluid, thickening or pericardial calcification. No
atherosclerotic calcifications are noted in the thoracic aorta or
the coronary arteries.

Mediastinum/Nodes: No pathologically enlarged mediastinal or hilar
lymph nodes. Please note that accurate exclusion of hilar adenopathy
is limited on noncontrast CT scans. Esophagus is unremarkable in
appearance. No axillary lymphadenopathy.

Lungs/Pleura: High-resolution images demonstrate no significant
regions of ground-glass attenuation, subpleural reticulation,
parenchymal banding, traction bronchiectasis or frank honeycombing.
Inspiratory and expiratory imaging demonstrates minimal air
trapping, indicative of mild small airways disease. No acute
consolidative airspace disease. No pleural effusions. No suspicious
appearing pulmonary nodules or masses.

Upper Abdomen: Unremarkable.

Musculoskeletal: There are no aggressive appearing lytic or blastic
lesions noted in the visualized portions of the skeleton.
IMPRESSION: 1. Subtle changes noted on the prior study have resolved on today's
examination, and are not indicative of underlying interstitial lung
disease. There are no findings on today's study to suggest
interstitial lung disease.
2. Minimal air trapping, indicative of mild small airways disease.

## 2018-07-17 ENCOUNTER — Ambulatory Visit (INDEPENDENT_AMBULATORY_CARE_PROVIDER_SITE_OTHER): Payer: BLUE CROSS/BLUE SHIELD | Admitting: *Deleted

## 2018-07-17 DIAGNOSIS — J309 Allergic rhinitis, unspecified: Secondary | ICD-10-CM | POA: Diagnosis not present

## 2018-07-24 ENCOUNTER — Ambulatory Visit (INDEPENDENT_AMBULATORY_CARE_PROVIDER_SITE_OTHER): Payer: BLUE CROSS/BLUE SHIELD

## 2018-07-24 DIAGNOSIS — J309 Allergic rhinitis, unspecified: Secondary | ICD-10-CM | POA: Diagnosis not present

## 2018-07-30 ENCOUNTER — Other Ambulatory Visit: Payer: Self-pay | Admitting: Allergy & Immunology

## 2018-08-01 DIAGNOSIS — M79621 Pain in right upper arm: Secondary | ICD-10-CM | POA: Diagnosis not present

## 2018-08-07 ENCOUNTER — Ambulatory Visit (INDEPENDENT_AMBULATORY_CARE_PROVIDER_SITE_OTHER): Payer: BLUE CROSS/BLUE SHIELD

## 2018-08-07 DIAGNOSIS — J309 Allergic rhinitis, unspecified: Secondary | ICD-10-CM

## 2018-08-14 ENCOUNTER — Ambulatory Visit (INDEPENDENT_AMBULATORY_CARE_PROVIDER_SITE_OTHER): Payer: BLUE CROSS/BLUE SHIELD | Admitting: *Deleted

## 2018-08-14 DIAGNOSIS — J309 Allergic rhinitis, unspecified: Secondary | ICD-10-CM | POA: Diagnosis not present

## 2018-08-16 DIAGNOSIS — M79601 Pain in right arm: Secondary | ICD-10-CM | POA: Diagnosis not present

## 2018-08-16 DIAGNOSIS — J309 Allergic rhinitis, unspecified: Secondary | ICD-10-CM | POA: Diagnosis not present

## 2018-08-21 ENCOUNTER — Ambulatory Visit (INDEPENDENT_AMBULATORY_CARE_PROVIDER_SITE_OTHER): Payer: BLUE CROSS/BLUE SHIELD | Admitting: *Deleted

## 2018-08-21 DIAGNOSIS — J309 Allergic rhinitis, unspecified: Secondary | ICD-10-CM | POA: Diagnosis not present

## 2018-08-30 ENCOUNTER — Other Ambulatory Visit: Payer: Self-pay | Admitting: *Deleted

## 2018-08-30 ENCOUNTER — Other Ambulatory Visit: Payer: Self-pay | Admitting: Allergy & Immunology

## 2018-08-30 MED ORDER — RANITIDINE HCL 150 MG PO TABS: 150.0000 mg | ORAL_TABLET | Freq: Every evening | ORAL | 0 refills | Status: DC

## 2018-09-04 ENCOUNTER — Ambulatory Visit (INDEPENDENT_AMBULATORY_CARE_PROVIDER_SITE_OTHER): Payer: BLUE CROSS/BLUE SHIELD | Admitting: *Deleted

## 2018-09-04 DIAGNOSIS — J309 Allergic rhinitis, unspecified: Secondary | ICD-10-CM

## 2018-09-11 ENCOUNTER — Ambulatory Visit (INDEPENDENT_AMBULATORY_CARE_PROVIDER_SITE_OTHER): Payer: BLUE CROSS/BLUE SHIELD

## 2018-09-11 DIAGNOSIS — J309 Allergic rhinitis, unspecified: Secondary | ICD-10-CM

## 2018-09-19 ENCOUNTER — Ambulatory Visit (INDEPENDENT_AMBULATORY_CARE_PROVIDER_SITE_OTHER): Payer: BLUE CROSS/BLUE SHIELD | Admitting: *Deleted

## 2018-09-19 DIAGNOSIS — J309 Allergic rhinitis, unspecified: Secondary | ICD-10-CM | POA: Diagnosis not present

## 2018-09-26 ENCOUNTER — Ambulatory Visit (INDEPENDENT_AMBULATORY_CARE_PROVIDER_SITE_OTHER): Payer: BLUE CROSS/BLUE SHIELD

## 2018-09-26 DIAGNOSIS — J309 Allergic rhinitis, unspecified: Secondary | ICD-10-CM | POA: Diagnosis not present

## 2018-10-05 ENCOUNTER — Ambulatory Visit (INDEPENDENT_AMBULATORY_CARE_PROVIDER_SITE_OTHER): Payer: BLUE CROSS/BLUE SHIELD

## 2018-10-05 DIAGNOSIS — J309 Allergic rhinitis, unspecified: Secondary | ICD-10-CM | POA: Diagnosis not present

## 2018-10-10 ENCOUNTER — Encounter: Payer: Self-pay | Admitting: *Deleted

## 2018-10-10 DIAGNOSIS — J309 Allergic rhinitis, unspecified: Secondary | ICD-10-CM

## 2018-11-05 ENCOUNTER — Other Ambulatory Visit: Payer: Self-pay | Admitting: Allergy & Immunology

## 2018-11-05 ENCOUNTER — Other Ambulatory Visit: Payer: Self-pay

## 2018-11-05 DIAGNOSIS — J988 Other specified respiratory disorders: Secondary | ICD-10-CM | POA: Diagnosis not present

## 2018-11-05 DIAGNOSIS — J45909 Unspecified asthma, uncomplicated: Secondary | ICD-10-CM | POA: Diagnosis not present

## 2018-11-05 MED ORDER — FAMOTIDINE 20 MG PO TABS: ORAL_TABLET | ORAL | 5 refills | Status: DC

## 2018-11-05 NOTE — Telephone Encounter (Signed)
Unable to leave message to let pt. Know we are replacing ranitidine with pepcid 20mg  once daily.

## 2018-11-06 NOTE — Telephone Encounter (Signed)
Left message regarding the replacement of ranitidine with the pepcid 20 mg. If any questions told pt. To feel free to call office.

## 2018-12-03 ENCOUNTER — Other Ambulatory Visit: Payer: Self-pay | Admitting: Allergy & Immunology

## 2019-02-18 DIAGNOSIS — J329 Chronic sinusitis, unspecified: Secondary | ICD-10-CM | POA: Diagnosis not present

## 2019-02-18 DIAGNOSIS — R05 Cough: Secondary | ICD-10-CM | POA: Diagnosis not present

## 2019-03-27 ENCOUNTER — Other Ambulatory Visit: Payer: Self-pay | Admitting: Allergy & Immunology

## 2019-05-01 ENCOUNTER — Other Ambulatory Visit: Payer: Self-pay | Admitting: Allergy & Immunology

## 2019-06-10 DIAGNOSIS — Z0000 Encounter for general adult medical examination without abnormal findings: Secondary | ICD-10-CM | POA: Diagnosis not present

## 2019-06-10 DIAGNOSIS — R7989 Other specified abnormal findings of blood chemistry: Secondary | ICD-10-CM | POA: Diagnosis not present

## 2019-06-10 DIAGNOSIS — R7301 Impaired fasting glucose: Secondary | ICD-10-CM | POA: Diagnosis not present

## 2019-06-18 DIAGNOSIS — J45909 Unspecified asthma, uncomplicated: Secondary | ICD-10-CM | POA: Diagnosis not present

## 2019-06-18 DIAGNOSIS — J309 Allergic rhinitis, unspecified: Secondary | ICD-10-CM | POA: Diagnosis not present

## 2019-06-18 DIAGNOSIS — Z0000 Encounter for general adult medical examination without abnormal findings: Secondary | ICD-10-CM | POA: Diagnosis not present

## 2019-06-18 DIAGNOSIS — M7989 Other specified soft tissue disorders: Secondary | ICD-10-CM | POA: Diagnosis not present

## 2019-06-18 DIAGNOSIS — Z23 Encounter for immunization: Secondary | ICD-10-CM | POA: Diagnosis not present

## 2019-07-15 ENCOUNTER — Other Ambulatory Visit: Payer: Self-pay | Admitting: Allergy & Immunology

## 2019-07-29 ENCOUNTER — Other Ambulatory Visit: Payer: Self-pay | Admitting: Allergy & Immunology

## 2019-08-10 DIAGNOSIS — H5210 Myopia, unspecified eye: Secondary | ICD-10-CM | POA: Diagnosis not present

## 2019-09-09 DIAGNOSIS — U071 COVID-19: Secondary | ICD-10-CM | POA: Diagnosis not present

## 2019-09-12 ENCOUNTER — Other Ambulatory Visit: Payer: Self-pay | Admitting: *Deleted

## 2019-09-12 DIAGNOSIS — Z20828 Contact with and (suspected) exposure to other viral communicable diseases: Secondary | ICD-10-CM | POA: Diagnosis not present

## 2019-09-12 DIAGNOSIS — Z20822 Contact with and (suspected) exposure to covid-19: Secondary | ICD-10-CM

## 2019-09-14 LAB — NOVEL CORONAVIRUS, NAA: SARS-CoV-2, NAA: NOT DETECTED

## 2019-10-04 DIAGNOSIS — R600 Localized edema: Secondary | ICD-10-CM | POA: Diagnosis not present

## 2019-10-04 DIAGNOSIS — I10 Essential (primary) hypertension: Secondary | ICD-10-CM | POA: Diagnosis not present

## 2019-11-04 DIAGNOSIS — I10 Essential (primary) hypertension: Secondary | ICD-10-CM | POA: Diagnosis not present

## 2019-11-12 DIAGNOSIS — Z6833 Body mass index (BMI) 33.0-33.9, adult: Secondary | ICD-10-CM | POA: Diagnosis not present

## 2019-11-12 DIAGNOSIS — Z01419 Encounter for gynecological examination (general) (routine) without abnormal findings: Secondary | ICD-10-CM | POA: Diagnosis not present

## 2019-11-12 DIAGNOSIS — Z13820 Encounter for screening for osteoporosis: Secondary | ICD-10-CM | POA: Diagnosis not present

## 2019-11-12 DIAGNOSIS — N751 Abscess of Bartholin's gland: Secondary | ICD-10-CM | POA: Diagnosis not present

## 2019-11-12 DIAGNOSIS — Z1231 Encounter for screening mammogram for malignant neoplasm of breast: Secondary | ICD-10-CM | POA: Diagnosis not present

## 2019-11-12 DIAGNOSIS — N952 Postmenopausal atrophic vaginitis: Secondary | ICD-10-CM | POA: Diagnosis not present

## 2020-02-03 ENCOUNTER — Ambulatory Visit: Payer: BLUE CROSS/BLUE SHIELD | Attending: Internal Medicine

## 2020-02-03 ENCOUNTER — Other Ambulatory Visit: Payer: Self-pay

## 2020-02-03 DIAGNOSIS — Z20822 Contact with and (suspected) exposure to covid-19: Secondary | ICD-10-CM

## 2020-02-04 LAB — NOVEL CORONAVIRUS, NAA: SARS-CoV-2, NAA: NOT DETECTED

## 2020-02-05 ENCOUNTER — Other Ambulatory Visit: Payer: BC Managed Care – PPO

## 2020-04-06 DIAGNOSIS — R05 Cough: Secondary | ICD-10-CM | POA: Diagnosis not present

## 2020-04-06 DIAGNOSIS — J343 Hypertrophy of nasal turbinates: Secondary | ICD-10-CM | POA: Diagnosis not present

## 2020-04-06 DIAGNOSIS — J342 Deviated nasal septum: Secondary | ICD-10-CM | POA: Diagnosis not present

## 2020-04-06 DIAGNOSIS — J310 Chronic rhinitis: Secondary | ICD-10-CM | POA: Diagnosis not present

## 2020-05-08 DIAGNOSIS — Z01818 Encounter for other preprocedural examination: Secondary | ICD-10-CM | POA: Diagnosis not present

## 2020-05-14 DIAGNOSIS — J3489 Other specified disorders of nose and nasal sinuses: Secondary | ICD-10-CM | POA: Diagnosis not present

## 2020-05-14 DIAGNOSIS — J342 Deviated nasal septum: Secondary | ICD-10-CM | POA: Diagnosis not present

## 2020-05-14 DIAGNOSIS — J343 Hypertrophy of nasal turbinates: Secondary | ICD-10-CM | POA: Diagnosis not present

## 2020-06-30 DIAGNOSIS — I10 Essential (primary) hypertension: Secondary | ICD-10-CM | POA: Diagnosis not present

## 2020-06-30 DIAGNOSIS — R7989 Other specified abnormal findings of blood chemistry: Secondary | ICD-10-CM | POA: Diagnosis not present

## 2020-06-30 DIAGNOSIS — Z0000 Encounter for general adult medical examination without abnormal findings: Secondary | ICD-10-CM | POA: Diagnosis not present

## 2020-06-30 DIAGNOSIS — R7301 Impaired fasting glucose: Secondary | ICD-10-CM | POA: Diagnosis not present

## 2020-07-07 DIAGNOSIS — Z0000 Encounter for general adult medical examination without abnormal findings: Secondary | ICD-10-CM | POA: Diagnosis not present

## 2020-09-07 DIAGNOSIS — R300 Dysuria: Secondary | ICD-10-CM | POA: Diagnosis not present

## 2020-09-07 DIAGNOSIS — N390 Urinary tract infection, site not specified: Secondary | ICD-10-CM | POA: Diagnosis not present

## 2020-12-10 DIAGNOSIS — Z01419 Encounter for gynecological examination (general) (routine) without abnormal findings: Secondary | ICD-10-CM | POA: Diagnosis not present

## 2020-12-10 DIAGNOSIS — Z6835 Body mass index (BMI) 35.0-35.9, adult: Secondary | ICD-10-CM | POA: Diagnosis not present

## 2020-12-14 DIAGNOSIS — Z01419 Encounter for gynecological examination (general) (routine) without abnormal findings: Secondary | ICD-10-CM | POA: Diagnosis not present

## 2021-01-05 DIAGNOSIS — J343 Hypertrophy of nasal turbinates: Secondary | ICD-10-CM | POA: Diagnosis not present

## 2021-01-05 DIAGNOSIS — R0982 Postnasal drip: Secondary | ICD-10-CM | POA: Diagnosis not present

## 2021-01-05 DIAGNOSIS — J310 Chronic rhinitis: Secondary | ICD-10-CM | POA: Diagnosis not present

## 2021-01-05 DIAGNOSIS — Z1231 Encounter for screening mammogram for malignant neoplasm of breast: Secondary | ICD-10-CM | POA: Diagnosis not present

## 2021-04-06 ENCOUNTER — Ambulatory Visit: Payer: BC Managed Care – PPO | Admitting: Physician Assistant

## 2021-04-06 ENCOUNTER — Other Ambulatory Visit: Payer: Self-pay

## 2021-04-06 DIAGNOSIS — B351 Tinea unguium: Secondary | ICD-10-CM | POA: Diagnosis not present

## 2021-04-06 DIAGNOSIS — C44519 Basal cell carcinoma of skin of other part of trunk: Secondary | ICD-10-CM

## 2021-04-06 DIAGNOSIS — L821 Other seborrheic keratosis: Secondary | ICD-10-CM | POA: Diagnosis not present

## 2021-04-06 DIAGNOSIS — D1800 Hemangioma unspecified site: Secondary | ICD-10-CM

## 2021-04-06 DIAGNOSIS — Z1283 Encounter for screening for malignant neoplasm of skin: Secondary | ICD-10-CM | POA: Diagnosis not present

## 2021-04-06 DIAGNOSIS — C4491 Basal cell carcinoma of skin, unspecified: Secondary | ICD-10-CM

## 2021-04-06 DIAGNOSIS — L814 Other melanin hyperpigmentation: Secondary | ICD-10-CM

## 2021-04-06 DIAGNOSIS — L578 Other skin changes due to chronic exposure to nonionizing radiation: Secondary | ICD-10-CM

## 2021-04-06 DIAGNOSIS — D485 Neoplasm of uncertain behavior of skin: Secondary | ICD-10-CM

## 2021-04-06 DIAGNOSIS — D229 Melanocytic nevi, unspecified: Secondary | ICD-10-CM

## 2021-04-06 HISTORY — DX: Basal cell carcinoma of skin, unspecified: C44.91

## 2021-04-15 ENCOUNTER — Telehealth: Payer: Self-pay | Admitting: *Deleted

## 2021-04-15 ENCOUNTER — Encounter: Payer: Self-pay | Admitting: Physician Assistant

## 2021-04-15 NOTE — Telephone Encounter (Signed)
-----   Message from Warren Danes, [place] sent at 04/15/2021 11:17 AM EDT ----- 30

## 2021-04-15 NOTE — Progress Notes (Signed)
   New Patient   Subjective  Charlotte Roth is a 60 y.o. female who presents for the following: Skin Problem (Here to have a few spots looked at. There is a lesion on stomach and right chest they are brown and  scaly. Patient has knot behind right knee x couple months. No pain just there. Patient also has toe fungus and tried jublia but it did not help. ).   The following portions of the chart were reviewed this encounter and updated as appropriate:      Objective  Well appearing patient in no apparent distress; mood and affect are within normal limits.  A full examination was performed including scalp, head, eyes, ears, nose, lips, neck, chest, axillae, abdomen, back, buttocks, bilateral upper extremities, bilateral lower extremities, hands, feet, fingers, toes, fingernails, and toenails. All findings within normal limits unless otherwise noted below.  Objective  Right Chest: Pearly papule with telangectasia.       Assessment & Plan  Neoplasm of uncertain behavior of skin Right Chest  Skin / nail biopsy Type of biopsy: tangential   Informed consent: discussed and consent obtained   Timeout: patient name, date of birth, surgical site, and procedure verified   Procedure prep:  Patient was prepped and draped in usual sterile fashion (Non sterile) Prep type:  Chlorhexidine Anesthesia: the lesion was anesthetized in a standard fashion   Anesthetic:  1% lidocaine w/ epinephrine 1-100,000 local infiltration Instrument used: flexible razor blade   Outcome: patient tolerated procedure well   Post-procedure details: wound care instructions given    Specimen 1 - Surgical pathology Differential Diagnosis: atypia, bcc, scc  Check Margins: No   Lentigines - Scattered tan macules - Discussed due to sun exposure - Benign, observe - Call for any changes  Seborrheic Keratoses- abdomen - Stuck-on, waxy, tan-brown papules and plaques  - Discussed benign etiology and prognosis. -  Observe - Call for any changes  Melanocytic Nevi - Tan-brown and/or pink-flesh-colored symmetric macules and papules - Benign appearing on exam today - Observation - Call clinic for new or changing moles - Recommend daily use of broad spectrum spf 30+ sunscreen to sun-exposed areas.   Hemangiomas - Red papules - Discussed benign nature - Observe - Call for any changes  Actinic Damage - diffuse scaly erythematous macules with underlying dyspigmentation - Recommend daily broad spectrum sunscreen SPF 30+ to sun-exposed areas, reapply every 2 hours as needed.  - Call for new or changing lesions.  Skin cancer screening performed today.   I, Saleen Peden, PA-C, have reviewed all documentation for this visit. The documentation on 04/15/21 for the exam, diagnosis, procedures, and orders are all accurate and complete.

## 2021-04-15 NOTE — Telephone Encounter (Signed)
Left message for patient to return our phone call.

## 2021-04-26 ENCOUNTER — Encounter: Payer: Self-pay | Admitting: Physician Assistant

## 2021-04-26 ENCOUNTER — Telehealth: Payer: Self-pay | Admitting: Physician Assistant

## 2021-04-26 NOTE — Telephone Encounter (Signed)
Patient left message on office voice mail saying that she was calling for the results from last visit with [facility], PA-C.

## 2021-04-26 NOTE — Telephone Encounter (Signed)
Phone call to patient with her pathology results. Voicemail left for patient to give the office a call back.  

## 2021-04-26 NOTE — Telephone Encounter (Signed)
-----   Message from Warren Danes, [place] sent at 04/15/2021 11:17 AM EDT ----- 30

## 2021-04-29 ENCOUNTER — Encounter: Payer: Self-pay | Admitting: Physician Assistant

## 2021-04-29 NOTE — Telephone Encounter (Signed)
-----   Message from Warren Danes, [place] sent at 04/15/2021 11:17 AM EDT ----- 30

## 2021-04-29 NOTE — Telephone Encounter (Signed)
Phone call to patient with her pathology results. Patient aware of results.  

## 2021-05-19 ENCOUNTER — Encounter: Payer: BC Managed Care – PPO | Admitting: Physician Assistant

## 2021-07-01 ENCOUNTER — Other Ambulatory Visit: Payer: Self-pay

## 2021-07-01 ENCOUNTER — Ambulatory Visit (INDEPENDENT_AMBULATORY_CARE_PROVIDER_SITE_OTHER): Payer: BC Managed Care – PPO | Admitting: Dermatology

## 2021-07-01 DIAGNOSIS — C44519 Basal cell carcinoma of skin of other part of trunk: Secondary | ICD-10-CM | POA: Diagnosis not present

## 2021-07-01 DIAGNOSIS — C4491 Basal cell carcinoma of skin, unspecified: Secondary | ICD-10-CM

## 2021-07-01 NOTE — Patient Instructions (Signed)

## 2021-07-06 DIAGNOSIS — R7301 Impaired fasting glucose: Secondary | ICD-10-CM | POA: Diagnosis not present

## 2021-07-06 DIAGNOSIS — Z0000 Encounter for general adult medical examination without abnormal findings: Secondary | ICD-10-CM | POA: Diagnosis not present

## 2021-07-06 DIAGNOSIS — E559 Vitamin D deficiency, unspecified: Secondary | ICD-10-CM | POA: Diagnosis not present

## 2021-07-06 DIAGNOSIS — R7989 Other specified abnormal findings of blood chemistry: Secondary | ICD-10-CM | POA: Diagnosis not present

## 2021-07-13 DIAGNOSIS — R7301 Impaired fasting glucose: Secondary | ICD-10-CM | POA: Diagnosis not present

## 2021-07-13 DIAGNOSIS — Z85828 Personal history of other malignant neoplasm of skin: Secondary | ICD-10-CM | POA: Diagnosis not present

## 2021-07-13 DIAGNOSIS — R7989 Other specified abnormal findings of blood chemistry: Secondary | ICD-10-CM | POA: Diagnosis not present

## 2021-07-13 DIAGNOSIS — Z0000 Encounter for general adult medical examination without abnormal findings: Secondary | ICD-10-CM | POA: Diagnosis not present

## 2021-07-21 ENCOUNTER — Encounter: Payer: Self-pay | Admitting: Dermatology

## 2021-07-21 NOTE — Progress Notes (Signed)
   Follow-Up Visit   Subjective  Charlotte Roth is a 60 y.o. female who presents for the following: Procedure (Patient here today for treatment of [place] right chest).  BCC right chest Location:  Duration:  Quality:  Associated Signs/Symptoms: Modifying Factors:  Severity:  Timing: Context:   Objective  Well appearing patient in no apparent distress; mood and affect are within normal limits. right chest Lesion identified by Dr.Zilpha Mcandrew and nurse in room.      A focused examination was performed including torso, head, neck.  In. Relevant physical exam findings are noted in the Assessment and Plan.   Assessment & Plan    Basal cell carcinoma (BCC), unspecified site right chest  Destruction of lesion Complexity: simple   Destruction method: electrodesiccation and curettage   Informed consent: discussed and consent obtained   Timeout:  patient name, date of birth, surgical site, and procedure verified Anesthesia: the lesion was anesthetized in a standard fashion   Anesthetic:  1% lidocaine w/ epinephrine 1-100,000 local infiltration Curettage performed in three different directions: Yes   Electrodesiccation performed over the curetted area: Yes   Curettage cycles:  3 Lesion length (cm):  1.5 Lesion width (cm):  1.5 Margin per side (cm):  0 Final wound size (cm):  1.5 Hemostasis achieved with:  ferric subsulfate Outcome: patient tolerated procedure well with no complications   Additional details:  Wound innoculated with 5 fluorouracil solution.      I, Lavonna Monarch, MD, have reviewed all documentation for this visit.  The documentation on 07/21/21 for the exam, diagnosis, procedures, and orders are all accurate and complete.

## 2021-09-29 ENCOUNTER — Ambulatory Visit: Payer: BC Managed Care – PPO | Admitting: Dermatology

## 2021-11-18 ENCOUNTER — Telehealth: Payer: Self-pay | Admitting: Internal Medicine

## 2021-11-18 NOTE — Telephone Encounter (Signed)
Per Dr. Gala Romney, it is ok to schedule as a new patient if she calls for appointment.

## 2022-03-22 ENCOUNTER — Ambulatory Visit: Payer: BC Managed Care – PPO | Admitting: Gastroenterology

## 2022-04-21 ENCOUNTER — Encounter: Payer: Self-pay | Admitting: Gastroenterology

## 2022-04-21 ENCOUNTER — Ambulatory Visit: Payer: BC Managed Care – PPO | Admitting: Gastroenterology

## 2022-04-21 VITALS — BP 118/68 | HR 88 | Temp 97.3°F | Ht 63.0 in | Wt 208.4 lb

## 2022-04-21 DIAGNOSIS — R053 Chronic cough: Secondary | ICD-10-CM

## 2022-04-21 DIAGNOSIS — K219 Gastro-esophageal reflux disease without esophagitis: Secondary | ICD-10-CM | POA: Diagnosis not present

## 2022-04-21 MED ORDER — PANTOPRAZOLE SODIUM 40 MG PO TBEC: 40.0000 mg | DELAYED_RELEASE_TABLET | Freq: Every day | ORAL | 3 refills | Status: DC

## 2022-04-21 NOTE — Patient Instructions (Signed)
Let's stop omeprazole. Instead, trial of pantoprazole (Protonix) 40 mg. Take once each morning, 30 minutes before breakfast. You can continue the ranitidine.  As we discussed, it's helpful to do lifestyle/behavior changes. I have included a handout on this for you.   Let us know how you are doing in about 2 weeks!  We will see you in 6 months!  It was a pleasure to see you today. I want to create trusting relationships with patients to provide genuine, compassionate, and quality care. I value your feedback. If you receive a survey regarding your visit,  I greatly appreciate you taking time to fill this out.   Annitta Needs, PhD, ANP-BC [facility]

## 2022-04-21 NOTE — Progress Notes (Signed)
Gastroenterology Office Note     Primary Care Physician:  Janie Morning, DO  Primary GI: Dr. Gala Romney     Chief Complaint   Chief Complaint  Patient presents with   New Patient (Initial Visit)    Cough and reflux     History of Present Illness   Charlotte Roth is a 61 y.o. female presenting today as a self-referral due to chronic cough and GERD. EGD in 2017 by Dr. Paulita Fujita unrevealing. Bravo study with normal score.    Chronic cough on and off whole life. No dysphagia. Occasional epigastric pain for about 2-3 years. No weight loss or lack of appetite. No globus sensation. Will have coughing spells until vomiting. Change in temperature will affect this. Certain foods will affect. Has been tested for food allergies. Has seen ENT. Had deviated septum repair last year. Pulmonary: sent patient to ENT in [place]. Then ENT sent to Speech Pathologist. Soft drinks make her cough. Rare carbonation. 1-2 glasses of wine a night. Diet "all over the place". Will sometimes eat later at night.   Omeprazole in morning 20 mg chronically. Has taken Dexilant in the past but didn't notice much improvement. Sometimes heartburn not controlled. Last colonoscopy with Dr. Paulita Fujita in [place]. No polyps. Believes she is due next year.    Past Medical History:  Diagnosis Date   ALLERGIC RHINITIS    Asthma    pt unsure because some doctors say she has asthma, others say no   Bronchitis 10/2016   about a month ago   Chronic cough    coughing and wheezing episode-uses Inhalers as needed   GERD (gastroesophageal reflux disease)    Pigmented basal cell carcinoma (BCC) 04/06/2021   Right Chest   PONV (postoperative nausea and vomiting)     Past Surgical History:  Procedure Laterality Date   BRAVO [place] STUDY N/A 11/23/2016   Procedure: BRAVO [place];  Surgeon: Arta Silence, MD;  Location: [facility];  Service: Endoscopy;  Laterality: N/A;   ESOPHAGOGASTRODUODENOSCOPY (EGD) WITH PROPOFOL N/A  11/23/2016   Procedure: ESOPHAGOGASTRODUODENOSCOPY (EGD) WITH PROPOFOL;  Surgeon: Arta Silence, MD;  Location: [facility];  Service: Endoscopy;  Laterality: N/A;   OOPHORECTOMY  age 51   left ovary"open bikini cut"   TONSILLECTOMY      Current Outpatient Medications  Medication Sig Dispense Refill   Beclomethasone Dipropionate 80 MCG/ACT AERS Place 1 spray into the nose 2 (two) times daily.     famotidine (PEPCID) 20 MG tablet Take one tablet once at bedtime for reflux. 34 tablet 5   gabapentin (NEURONTIN) 300 MG capsule TAKE ONE CAPSULE BY MOUTH THREE TIMES DAILY 90 capsule 0   levocetirizine (XYZAL) 5 MG tablet SMARTSIG:1 Tablet(s) By Mouth Every Evening     losartan (COZAAR) 100 MG tablet Take 1 tablet by mouth daily.     montelukast (SINGULAIR) 10 MG tablet TAKE ONE TABLET BY MOUTH AT BEDTIME. 30 tablet 3   Omeprazole Magnesium (PRILOSEC PO) omeprazole  '20mg'$      ranitidine (ZANTAC 150 MAXIMUM STRENGTH) 150 MG tablet Take 1 tablet (150 mg total) by mouth every evening. 30 tablet 0   ipratropium (ATROVENT) 0.03 % nasal spray 2 applications in each nostril as needed (Patient not taking: Reported on 04/21/2022)     PROAIR HFA 108 (90 Base) MCG/ACT inhaler Inhale 2 puffs into the lungs as needed. (Patient not taking: Reported on 04/21/2022) 1 Inhaler 1   RYVENT 6 MG TABS TAKE ONE TABLET BY  MOUTH TWICE DAILY AS NEEDED. (Patient not taking: Reported on 04/21/2022) 60 tablet 3   spironolactone (ALDACTONE) 25 MG tablet 1 tablet (Patient not taking: Reported on 04/21/2022)     Vitamin D, Ergocalciferol, (DRISDOL) 50000 units CAPS capsule Take 50,000 Units by mouth once a week. (Patient not taking: Reported on 04/21/2022)  0   No current facility-administered medications for this visit.    Allergies as of 04/21/2022 - Review Complete 04/21/2022  Allergen Reaction Noted   Codeine Nausea And Vomiting 05/12/2011    Family History  Problem Relation Age of Onset   Asthma Mother    Allergic  rhinitis Mother    Breast cancer Mother 8   Pulmonary fibrosis Father    Asthma Maternal Grandfather     Social History   Socioeconomic History   Marital status: Married    Spouse name: Not on file   Number of children: 0   Years of education: Not on file   Highest education level: Not on file  Occupational History   Occupation: Insurance  Tobacco Use   Smoking status: Never   Smokeless tobacco: Never  Vaping Use   Vaping Use: Never used  Substance and Sexual Activity   Alcohol use: Yes    Alcohol/week: 3.0 standard drinks    Types: 3 Standard drinks or equivalent per week    Comment: glass of wine every other day   Drug use: No   Sexual activity: Not on file  Other Topics Concern   Not on file  Social History Narrative   Not on file   Social Determinants of Health   Financial Resource Strain: Not on file  Food Insecurity: Not on file  Transportation Needs: Not on file  Physical Activity: Not on file  Stress: Not on file  Social Connections: Not on file  Intimate Partner Violence: Not on file     Review of Systems   Gen: Denies any fever, chills, fatigue, weight loss, lack of appetite.  CV: Denies chest pain, heart palpitations, peripheral edema, syncope.  Resp: Denies shortness of breath at rest or with exertion. Denies wheezing or cough.  GI: see HPI GU : Denies urinary burning, urinary frequency, urinary hesitancy MS: Denies joint pain, muscle weakness, cramps, or limitation of movement.  Derm: Denies rash, itching, dry skin Psych: Denies depression, anxiety, memory loss, and confusion Heme: Denies bruising, bleeding, and enlarged lymph nodes.   Physical Exam   BP 118/68   Pulse 88   Temp (!) 97.3 F (36.3 C)   Ht '5\' 3"'$  (1.6 m)   Wt 208 lb 6.4 oz (94.5 kg)   LMP 10/25/2011   BMI 36.92 kg/m  General:   Alert and oriented. Pleasant and cooperative. Well-nourished and well-developed.  Head:  Normocephalic and atraumatic. Eyes:  Without  icterus Ears:  Normal auditory acuity. Lungs:  Clear to auscultation bilaterally.  Heart:  S1, S2 present without murmurs appreciated.  Abdomen:  +BS, soft, non-tender and non-distended. No HSM noted. No guarding or rebound. No masses appreciated.  Rectal:  Deferred  Msk:  Symmetrical without gross deformities. Normal posture. Extremities:  Without edema. Neurologic:  Alert and  oriented x4;  grossly normal neurologically. Skin:  Intact without significant lesions or rashes. Psych:  Alert and cooperative. Normal mood and affect.   Assessment   Charlotte Roth is a 61 y.o. female presenting today with history of chronic cough in the setting of GERD, previously evaluated over the years by Allergy Specialist, ENTs, and Pulmonology.  Last EGD unrevealing in 2017 and normal DeMeester score on Bravo study. We discussed aggressive measures including diet and behavior modification, weight loss, increasing PPI dosage (currently omeprazole 20 mg daily) and actually changing PPI therapy altogether.   If no improvement despite this, we can pursue EGD and possibly updating pH study. For now, I suspect dietary/behavior playing a role. She is eager to make lifestyle changes.    PLAN     Stop omeprazole  Start pantoprazole 40 mg daily. May need BID  Can continue ranitidine at night  Diet/behavior changes  Follow-up 6 months  Progress report in 2 weeks    Annitta Needs, PhD, ANP-BC [facility]astroenter[facility]

## 2022-05-03 DIAGNOSIS — Z01419 Encounter for gynecological examination (general) (routine) without abnormal findings: Secondary | ICD-10-CM | POA: Diagnosis not present

## 2022-05-03 DIAGNOSIS — Z6836 Body mass index (BMI) 36.0-36.9, adult: Secondary | ICD-10-CM | POA: Diagnosis not present

## 2022-05-03 DIAGNOSIS — Z1231 Encounter for screening mammogram for malignant neoplasm of breast: Secondary | ICD-10-CM | POA: Diagnosis not present

## 2022-05-05 ENCOUNTER — Other Ambulatory Visit: Payer: Self-pay | Admitting: Obstetrics and Gynecology

## 2022-05-05 DIAGNOSIS — R7301 Impaired fasting glucose: Secondary | ICD-10-CM | POA: Diagnosis not present

## 2022-05-05 DIAGNOSIS — Z0000 Encounter for general adult medical examination without abnormal findings: Secondary | ICD-10-CM | POA: Diagnosis not present

## 2022-05-05 DIAGNOSIS — R928 Other abnormal and inconclusive findings on diagnostic imaging of breast: Secondary | ICD-10-CM

## 2022-05-05 DIAGNOSIS — R7989 Other specified abnormal findings of blood chemistry: Secondary | ICD-10-CM | POA: Diagnosis not present

## 2022-05-11 ENCOUNTER — Ambulatory Visit: Payer: BC Managed Care – PPO

## 2022-05-11 ENCOUNTER — Ambulatory Visit
Admission: RE | Admit: 2022-05-11 | Discharge: 2022-05-11 | Disposition: A | Discharge status: Home / Self Care | Payer: BC Managed Care – PPO | Source: Ambulatory Visit | Attending: Obstetrics and Gynecology | Admitting: Obstetrics and Gynecology

## 2022-05-11 DIAGNOSIS — R928 Other abnormal and inconclusive findings on diagnostic imaging of breast: Secondary | ICD-10-CM | POA: Diagnosis not present

## 2022-07-13 DIAGNOSIS — I10 Essential (primary) hypertension: Secondary | ICD-10-CM | POA: Diagnosis not present

## 2022-07-13 DIAGNOSIS — R7301 Impaired fasting glucose: Secondary | ICD-10-CM | POA: Diagnosis not present

## 2022-07-13 DIAGNOSIS — E7800 Pure hypercholesterolemia, unspecified: Secondary | ICD-10-CM | POA: Diagnosis not present

## 2022-07-13 DIAGNOSIS — E559 Vitamin D deficiency, unspecified: Secondary | ICD-10-CM | POA: Diagnosis not present

## 2022-07-13 DIAGNOSIS — R7989 Other specified abnormal findings of blood chemistry: Secondary | ICD-10-CM | POA: Diagnosis not present

## 2022-07-20 DIAGNOSIS — Z0000 Encounter for general adult medical examination without abnormal findings: Secondary | ICD-10-CM | POA: Diagnosis not present

## 2022-07-20 DIAGNOSIS — K219 Gastro-esophageal reflux disease without esophagitis: Secondary | ICD-10-CM | POA: Diagnosis not present

## 2022-07-20 DIAGNOSIS — L309 Dermatitis, unspecified: Secondary | ICD-10-CM | POA: Diagnosis not present

## 2022-07-20 DIAGNOSIS — R059 Cough, unspecified: Secondary | ICD-10-CM | POA: Diagnosis not present

## 2022-10-06 DIAGNOSIS — Z23 Encounter for immunization: Secondary | ICD-10-CM | POA: Diagnosis not present

## 2022-11-01 ENCOUNTER — Encounter: Payer: Self-pay | Admitting: Gastroenterology

## 2022-11-01 ENCOUNTER — Ambulatory Visit: Payer: BC Managed Care – PPO | Admitting: Gastroenterology

## 2022-11-01 VITALS — BP 150/89 | HR 80 | Temp 99.0°F | Ht 63.0 in | Wt 207.2 lb

## 2022-11-01 DIAGNOSIS — R1013 Epigastric pain: Secondary | ICD-10-CM

## 2022-11-01 DIAGNOSIS — Z1211 Encounter for screening for malignant neoplasm of colon: Secondary | ICD-10-CM | POA: Diagnosis not present

## 2022-11-01 DIAGNOSIS — R053 Chronic cough: Secondary | ICD-10-CM | POA: Diagnosis not present

## 2022-11-01 DIAGNOSIS — K219 Gastro-esophageal reflux disease without esophagitis: Secondary | ICD-10-CM | POA: Diagnosis not present

## 2022-11-01 MED ORDER — PANTOPRAZOLE SODIUM 40 MG PO TBEC: 40.0000 mg | DELAYED_RELEASE_TABLET | Freq: Two times a day (BID) | ORAL | 5 refills | Status: AC

## 2022-11-01 NOTE — Progress Notes (Signed)
Gastroenterology Office Note     Primary Care Physician:  Janie Morning, DO  Primary Gastroenterologist: Dr. Gala Romney    Chief Complaint   Chief Complaint  Patient presents with   Follow-up    Pt states cough is no better     History of Present Illness   Charlotte Roth is a 61 y.o. female presenting today in follow-up with a history of chronic cough and GERD. EGD in 2017 by Dr. Paulita Fujita unrevealing. Bravo study with normal score.    She has had a lifetime of chronic coughing. No dysphagia. Changes in temperature affect this. Has been tested for food allergies and seen ENT and Pulmonary. We changed from omeprazole to pantoprazole at last visit.   Pantoprazole daily. Famotidine at night. Cough is not better. Has had some intermittent epigastric discomfort. Postprandial component. Will radiate to LUQ. Will cough until she vomits. Rare NSAIDs until recently for just a brief stint due to breaking a rib. Alternating tylenol. Rare carbonation.    Last colonoscopy about 10 years ago. No records in epic. No FH colon cancer or polyps.       Past Medical History:  Diagnosis Date   ALLERGIC RHINITIS    Asthma    pt unsure because some doctors say she has asthma, others say no   Bronchitis 10/2016   about a month ago   Chronic cough    coughing and wheezing episode-uses Inhalers as needed   GERD (gastroesophageal reflux disease)    Pigmented basal cell carcinoma (BCC) 04/06/2021   Right Chest   PONV (postoperative nausea and vomiting)     Past Surgical History:  Procedure Laterality Date   BRAVO PH STUDY N/A 11/23/2016   normal DeMeester score   ESOPHAGOGASTRODUODENOSCOPY (EGD) WITH PROPOFOL N/A 11/23/2016   normal esophagus, few gastric polyps (benign), normal duodenum. Normal DeMeester score   OOPHORECTOMY  age 29   left ovary"open bikini cut"   TONSILLECTOMY      Current Outpatient Medications  Medication Sig Dispense Refill   Beclomethasone Dipropionate 80 MCG/ACT  AERS Place 1 spray into the nose 2 (two) times daily.     gabapentin (NEURONTIN) 300 MG capsule TAKE ONE CAPSULE BY MOUTH THREE TIMES DAILY 90 capsule 0   levocetirizine (XYZAL) 5 MG tablet SMARTSIG:1 Tablet(s) By Mouth Every Evening     losartan (COZAAR) 100 MG tablet Take 1 tablet by mouth daily.     montelukast (SINGULAIR) 10 MG tablet TAKE ONE TABLET BY MOUTH AT BEDTIME. 30 tablet 3   pantoprazole (PROTONIX) 40 MG tablet Take 1 tablet (40 mg total) by mouth 2 (two) times daily before a meal. Take 30 minutes before breakfast 180 tablet 5   No current facility-administered medications for this visit.    Allergies as of 11/01/2022 - Review Complete 11/01/2022  Allergen Reaction Noted   Codeine Nausea And Vomiting 05/12/2011    Family History  Problem Relation Age of Onset   Asthma Mother    Allergic rhinitis Mother    Breast cancer Mother 53   Pulmonary fibrosis Father    Asthma Maternal Grandfather    Colon cancer Neg Hx    Colon polyps Neg Hx     Social History   Socioeconomic History   Marital status: Married    Spouse name: Not on file   Number of children: 0   Years of education: Not on file   Highest education level: Not on file  Occupational History   Occupation: Insurance underwriter  Tobacco Use   Smoking status: Never   Smokeless tobacco: Never  Vaping Use   Vaping Use: Never used  Substance and Sexual Activity   Alcohol use: Yes    Alcohol/week: 3.0 standard drinks of alcohol    Types: 3 Standard drinks or equivalent per week    Comment: glass of wine every day   Drug use: No   Sexual activity: Not on file  Other Topics Concern   Not on file  Social History Narrative   Not on file   Social Determinants of Health   Financial Resource Strain: Not on file  Food Insecurity: Not on file  Transportation Needs: Not on file  Physical Activity: Not on file  Stress: Not on file  Social Connections: Not on file  Intimate Partner Violence: Not on file     Review  of Systems   Gen: Denies any fever, chills, fatigue, weight loss, lack of appetite.  CV: Denies chest pain, heart palpitations, peripheral edema, syncope.  Resp: Denies shortness of breath at rest or with exertion. Denies wheezing or cough.  GI: Denies dysphagia or odynophagia. Denies jaundice, hematemesis, fecal incontinence. GU : Denies urinary burning, urinary frequency, urinary hesitancy MS: Denies joint pain, muscle weakness, cramps, or limitation of movement.  Derm: Denies rash, itching, dry skin Psych: Denies depression, anxiety, memory loss, and confusion Heme: Denies bruising, bleeding, and enlarged lymph nodes.   Physical Exam   BP (!) 150/89   Pulse 80   Temp 99 F (37.2 C)   Ht '5\' 3"'$  (1.6 m)   Wt 207 lb 3.2 oz (94 kg)   LMP 10/25/2011   BMI 36.70 kg/m  General:   Alert and oriented. Pleasant and cooperative. Well-nourished and well-developed.  Head:  Normocephalic and atraumatic. Eyes:  Without icterus Abdomen:  +BS, soft, non-tender and non-distended. No HSM noted. No guarding or rebound. No masses appreciated.  Rectal:  Deferred  Msk:  Symmetrical without gross deformities. Normal posture. Extremities:  Without edema. Neurologic:  Alert and  oriented x4;  grossly normal neurologically. Skin:  Intact without significant lesions or rashes. Psych:  Alert and cooperative. Normal mood and affect.   Assessment   Charlotte Roth is a 61 y.o. female presenting today in follow-up with a history of chronic cough, GERD, and presenting today in follow-up.  She has been previously evaluated over the years for chronic cough by Allergy specialists, ENTs, and Pulmonology. Last EGD in 2017 unrevealing, and normal DeMeester score on Bravo study in the past.   Now with intermittent epigastric discomfort postprandially despite PPI daily. Rare NSAIDs. Continues with cough and has employed lifestyle/behavior modifications.   Last colonoscopy 10 years ago; no FH colon cancer or  polyps. She is due for routine screening.  Discussed pursuing EGD due to dyspepsia. May need to update pH study in future. She has been thoroughly evaluated in the past.    The patient was found to have elevated blood pressure when vital signs were checked in the office. The blood pressure was rechecked by the nursing staff, and it was found to be persistently elevated >140/90 mmHg. I personally advised the patient to follow up closely with the PCP for hypertension control.    PLAN    Colonoscopy for screening purposes and EGD for dyspepsia by Dr. Gala Romney. Risks and benefits discussed with stated understanding.  Increase pantoprazole to BID 6 month follow-up   Annitta Needs, PhD, ANP-BC [facility]

## 2022-11-01 NOTE — Patient Instructions (Addendum)
We are arranging a colonoscopy and upper endoscopy with Dr. Gala Romney in 2024.   Let's increase pantoprazole to twice a day, 30 minutes before breakfast and dinner.   We will see you in 6 months!  Please follow-up with your primary care about your blood pressure.   Have a great holiday season!   I enjoyed seeing you again today! As you know, I value our relationship and want to provide genuine, compassionate, and quality care. I welcome your feedback. If you receive a survey regarding your visit,  I greatly appreciate you taking time to fill this out. See you next time!  Annitta Needs, PhD, ANP-BC [facility]astroenterology

## 2023-03-29 ENCOUNTER — Encounter: Payer: Self-pay | Admitting: Gastroenterology

## 2023-05-08 DIAGNOSIS — Z1231 Encounter for screening mammogram for malignant neoplasm of breast: Secondary | ICD-10-CM | POA: Diagnosis not present

## 2023-05-08 DIAGNOSIS — Z01419 Encounter for gynecological examination (general) (routine) without abnormal findings: Secondary | ICD-10-CM | POA: Diagnosis not present

## 2023-05-08 DIAGNOSIS — Z6837 Body mass index (BMI) 37.0-37.9, adult: Secondary | ICD-10-CM | POA: Diagnosis not present

## 2023-07-03 DIAGNOSIS — Z1211 Encounter for screening for malignant neoplasm of colon: Secondary | ICD-10-CM | POA: Diagnosis not present

## 2023-07-03 DIAGNOSIS — R053 Chronic cough: Secondary | ICD-10-CM | POA: Diagnosis not present

## 2023-07-03 DIAGNOSIS — K219 Gastro-esophageal reflux disease without esophagitis: Secondary | ICD-10-CM | POA: Diagnosis not present

## 2023-07-19 DIAGNOSIS — K219 Gastro-esophageal reflux disease without esophagitis: Secondary | ICD-10-CM | POA: Diagnosis not present

## 2023-07-19 DIAGNOSIS — J309 Allergic rhinitis, unspecified: Secondary | ICD-10-CM | POA: Diagnosis not present

## 2023-07-19 DIAGNOSIS — I10 Essential (primary) hypertension: Secondary | ICD-10-CM | POA: Diagnosis not present

## 2023-07-19 DIAGNOSIS — R7301 Impaired fasting glucose: Secondary | ICD-10-CM | POA: Diagnosis not present

## 2023-07-19 DIAGNOSIS — R7989 Other specified abnormal findings of blood chemistry: Secondary | ICD-10-CM | POA: Diagnosis not present

## 2023-07-27 DIAGNOSIS — Z0000 Encounter for general adult medical examination without abnormal findings: Secondary | ICD-10-CM | POA: Diagnosis not present

## 2023-07-27 DIAGNOSIS — R059 Cough, unspecified: Secondary | ICD-10-CM | POA: Diagnosis not present

## 2023-07-27 DIAGNOSIS — J329 Chronic sinusitis, unspecified: Secondary | ICD-10-CM | POA: Diagnosis not present

## 2023-07-27 DIAGNOSIS — I10 Essential (primary) hypertension: Secondary | ICD-10-CM | POA: Diagnosis not present

## 2023-07-27 DIAGNOSIS — Z23 Encounter for immunization: Secondary | ICD-10-CM | POA: Diagnosis not present

## 2023-09-01 DIAGNOSIS — K5730 Diverticulosis of large intestine without perforation or abscess without bleeding: Secondary | ICD-10-CM | POA: Diagnosis not present

## 2023-09-01 DIAGNOSIS — Z1211 Encounter for screening for malignant neoplasm of colon: Secondary | ICD-10-CM | POA: Diagnosis not present

## 2023-09-01 DIAGNOSIS — K219 Gastro-esophageal reflux disease without esophagitis: Secondary | ICD-10-CM | POA: Diagnosis not present

## 2023-09-01 DIAGNOSIS — K649 Unspecified hemorrhoids: Secondary | ICD-10-CM | POA: Diagnosis not present

## 2023-09-01 DIAGNOSIS — D122 Benign neoplasm of ascending colon: Secondary | ICD-10-CM | POA: Diagnosis not present

## 2023-09-01 DIAGNOSIS — D120 Benign neoplasm of cecum: Secondary | ICD-10-CM | POA: Diagnosis not present

## 2023-09-01 DIAGNOSIS — K317 Polyp of stomach and duodenum: Secondary | ICD-10-CM | POA: Diagnosis not present

## 2023-11-07 DIAGNOSIS — Z23 Encounter for immunization: Secondary | ICD-10-CM | POA: Diagnosis not present

## 2023-11-07 DIAGNOSIS — Z79899 Other long term (current) drug therapy: Secondary | ICD-10-CM | POA: Diagnosis not present

## 2023-11-20 ENCOUNTER — Other Ambulatory Visit: Payer: Self-pay | Admitting: Gastroenterology

## 2024-01-13 DIAGNOSIS — H5213 Myopia, bilateral: Secondary | ICD-10-CM | POA: Diagnosis not present

## 2024-02-12 DIAGNOSIS — R0981 Nasal congestion: Secondary | ICD-10-CM | POA: Diagnosis not present

## 2024-02-12 DIAGNOSIS — R350 Frequency of micturition: Secondary | ICD-10-CM | POA: Diagnosis not present

## 2024-03-14 DIAGNOSIS — Z860101 Personal history of adenomatous and serrated colon polyps: Secondary | ICD-10-CM | POA: Diagnosis not present

## 2024-03-14 DIAGNOSIS — K219 Gastro-esophageal reflux disease without esophagitis: Secondary | ICD-10-CM | POA: Diagnosis not present

## 2024-06-03 DIAGNOSIS — Z6826 Body mass index (BMI) 26.0-26.9, adult: Secondary | ICD-10-CM | POA: Diagnosis not present

## 2024-06-03 DIAGNOSIS — Z1231 Encounter for screening mammogram for malignant neoplasm of breast: Secondary | ICD-10-CM | POA: Diagnosis not present

## 2024-06-03 DIAGNOSIS — Z01419 Encounter for gynecological examination (general) (routine) without abnormal findings: Secondary | ICD-10-CM | POA: Diagnosis not present

## 2024-06-03 DIAGNOSIS — Z124 Encounter for screening for malignant neoplasm of cervix: Secondary | ICD-10-CM | POA: Diagnosis not present

## 2024-07-23 DIAGNOSIS — R7301 Impaired fasting glucose: Secondary | ICD-10-CM | POA: Diagnosis not present

## 2024-07-23 DIAGNOSIS — R7989 Other specified abnormal findings of blood chemistry: Secondary | ICD-10-CM | POA: Diagnosis not present

## 2024-07-23 DIAGNOSIS — E559 Vitamin D deficiency, unspecified: Secondary | ICD-10-CM | POA: Diagnosis not present

## 2024-07-23 DIAGNOSIS — Z79899 Other long term (current) drug therapy: Secondary | ICD-10-CM | POA: Diagnosis not present

## 2024-07-23 DIAGNOSIS — I10 Essential (primary) hypertension: Secondary | ICD-10-CM | POA: Diagnosis not present

## 2024-07-23 DIAGNOSIS — E7800 Pure hypercholesterolemia, unspecified: Secondary | ICD-10-CM | POA: Diagnosis not present

## 2024-07-30 DIAGNOSIS — K219 Gastro-esophageal reflux disease without esophagitis: Secondary | ICD-10-CM | POA: Diagnosis not present

## 2024-07-30 DIAGNOSIS — I10 Essential (primary) hypertension: Secondary | ICD-10-CM | POA: Diagnosis not present

## 2024-07-30 DIAGNOSIS — Z0000 Encounter for general adult medical examination without abnormal findings: Secondary | ICD-10-CM | POA: Diagnosis not present

## 2024-07-30 DIAGNOSIS — Z79899 Other long term (current) drug therapy: Secondary | ICD-10-CM | POA: Diagnosis not present

## 2024-09-23 DIAGNOSIS — R0981 Nasal congestion: Secondary | ICD-10-CM | POA: Diagnosis not present

## 2024-09-23 DIAGNOSIS — Z6825 Body mass index (BMI) 25.0-25.9, adult: Secondary | ICD-10-CM | POA: Diagnosis not present

## 2024-09-23 DIAGNOSIS — J309 Allergic rhinitis, unspecified: Secondary | ICD-10-CM | POA: Diagnosis not present

## 2024-09-23 DIAGNOSIS — H6092 Unspecified otitis externa, left ear: Secondary | ICD-10-CM | POA: Diagnosis not present

## 2024-10-03 DIAGNOSIS — X32XXXA Exposure to sunlight, initial encounter: Secondary | ICD-10-CM | POA: Diagnosis not present

## 2024-10-03 DIAGNOSIS — L570 Actinic keratosis: Secondary | ICD-10-CM | POA: Diagnosis not present

## 2024-10-03 DIAGNOSIS — J019 Acute sinusitis, unspecified: Secondary | ICD-10-CM | POA: Diagnosis not present

## 2024-10-03 DIAGNOSIS — D485 Neoplasm of uncertain behavior of skin: Secondary | ICD-10-CM | POA: Diagnosis not present

## 2024-10-03 DIAGNOSIS — B351 Tinea unguium: Secondary | ICD-10-CM | POA: Diagnosis not present

## 2024-10-03 DIAGNOSIS — D1801 Hemangioma of skin and subcutaneous tissue: Secondary | ICD-10-CM | POA: Diagnosis not present

## 2024-10-03 DIAGNOSIS — C44722 Squamous cell carcinoma of skin of right lower limb, including hip: Secondary | ICD-10-CM | POA: Diagnosis not present

## 2024-10-03 DIAGNOSIS — B353 Tinea pedis: Secondary | ICD-10-CM | POA: Diagnosis not present

## 2024-10-03 DIAGNOSIS — J0100 Acute maxillary sinusitis, unspecified: Secondary | ICD-10-CM | POA: Diagnosis not present

## 2024-10-03 DIAGNOSIS — R059 Cough, unspecified: Secondary | ICD-10-CM | POA: Diagnosis not present

## 2024-10-16 DIAGNOSIS — D485 Neoplasm of uncertain behavior of skin: Secondary | ICD-10-CM | POA: Diagnosis not present
